# Patient Record
Sex: Female | Born: 1955 | Race: Black or African American | Hispanic: No | Smoking: Former smoker
Health system: Southern US, Community
[De-identification: ages and names within clinical notes are randomized; demographics above are authoritative.]

## PROBLEM LIST (undated history)

## (undated) DIAGNOSIS — I10 Essential (primary) hypertension: Secondary | ICD-10-CM

## (undated) DIAGNOSIS — D259 Leiomyoma of uterus, unspecified: Secondary | ICD-10-CM

## (undated) DIAGNOSIS — C3490 Malignant neoplasm of unspecified part of unspecified bronchus or lung: Secondary | ICD-10-CM

## (undated) DIAGNOSIS — I350 Nonrheumatic aortic (valve) stenosis: Secondary | ICD-10-CM

## (undated) DIAGNOSIS — A64 Unspecified sexually transmitted disease: Secondary | ICD-10-CM

## (undated) DIAGNOSIS — I351 Nonrheumatic aortic (valve) insufficiency: Secondary | ICD-10-CM

## (undated) DIAGNOSIS — I739 Peripheral vascular disease, unspecified: Secondary | ICD-10-CM

## (undated) DIAGNOSIS — K219 Gastro-esophageal reflux disease without esophagitis: Secondary | ICD-10-CM

## (undated) DIAGNOSIS — K589 Irritable bowel syndrome without diarrhea: Secondary | ICD-10-CM

## (undated) DIAGNOSIS — I7122 Aneurysm of the aortic arch, without rupture: Secondary | ICD-10-CM

## (undated) DIAGNOSIS — I7121 Aneurysm of the ascending aorta, without rupture: Secondary | ICD-10-CM

## (undated) DIAGNOSIS — I712 Thoracic aortic aneurysm, without rupture: Secondary | ICD-10-CM

## (undated) DIAGNOSIS — R011 Cardiac murmur, unspecified: Secondary | ICD-10-CM

## (undated) DIAGNOSIS — I5030 Unspecified diastolic (congestive) heart failure: Secondary | ICD-10-CM

## (undated) DIAGNOSIS — D219 Benign neoplasm of connective and other soft tissue, unspecified: Secondary | ICD-10-CM

## (undated) DIAGNOSIS — A599 Trichomoniasis, unspecified: Secondary | ICD-10-CM

## (undated) HISTORY — DX: Aneurysm of the aortic arch, without rupture: I71.22

## (undated) HISTORY — DX: Aneurysm of the ascending aorta, without rupture: I71.21

## (undated) HISTORY — DX: Leiomyoma of uterus, unspecified: D25.9

## (undated) HISTORY — DX: Nonrheumatic aortic (valve) insufficiency: I35.1

## (undated) HISTORY — PX: CARDIAC SURGERY: SHX584

## (undated) HISTORY — DX: Irritable bowel syndrome, unspecified: K58.9

## (undated) HISTORY — DX: Thoracic aortic aneurysm, without rupture: I71.2

## (undated) HISTORY — DX: Gastro-esophageal reflux disease without esophagitis: K21.9

## (undated) HISTORY — DX: Cardiac murmur, unspecified: R01.1

## (undated) HISTORY — DX: Benign neoplasm of connective and other soft tissue, unspecified: D21.9

## (undated) HISTORY — DX: Essential (primary) hypertension: I10

## (undated) HISTORY — PX: THORACIC AORTIC ANEURYSM REPAIR: SHX799

## (undated) HISTORY — DX: Malignant neoplasm of unspecified part of unspecified bronchus or lung: C34.90

## (undated) HISTORY — PX: ABDOMINAL HYSTERECTOMY: SHX81

## (undated) HISTORY — DX: Trichomoniasis, unspecified: A59.9

## (undated) HISTORY — DX: Peripheral vascular disease, unspecified: I73.9

## (undated) HISTORY — PX: LUNG SURGERY: SHX703

## (undated) HISTORY — PX: OTHER SURGICAL HISTORY: SHX169

## (undated) HISTORY — PX: TUBAL LIGATION: SHX77

## (undated) HISTORY — PX: AORTIC VALVE REPLACEMENT: SHX41

## (undated) HISTORY — DX: Unspecified sexually transmitted disease: A64

## (undated) HISTORY — PX: AORTIC VALVE REPAIR: SHX6306

---
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
## 2005-04-02 ENCOUNTER — Observation Stay (HOSPITAL_COMMUNITY): Admission: RE | Admit: 2005-04-02 | Discharge: 2005-04-03 | Payer: Self-pay | Admitting: Gynecology

## 2006-05-05 ENCOUNTER — Ambulatory Visit: Payer: Self-pay | Admitting: Internal Medicine

## 2006-05-07 ENCOUNTER — Ambulatory Visit: Payer: Self-pay | Admitting: Internal Medicine

## 2006-06-21 ENCOUNTER — Ambulatory Visit: Payer: Self-pay | Admitting: Surgery

## 2006-06-21 HISTORY — PX: BREAST EXCISIONAL BIOPSY: SUR124

## 2006-12-28 ENCOUNTER — Ambulatory Visit: Payer: Self-pay | Admitting: Internal Medicine

## 2007-02-28 ENCOUNTER — Ambulatory Visit: Payer: Self-pay | Admitting: Gastroenterology

## 2007-05-25 ENCOUNTER — Ambulatory Visit: Payer: Self-pay | Admitting: Internal Medicine

## 2008-06-13 ENCOUNTER — Ambulatory Visit: Payer: Self-pay | Admitting: Internal Medicine

## 2009-05-06 ENCOUNTER — Ambulatory Visit: Payer: Self-pay | Admitting: Family Medicine

## 2009-05-06 DIAGNOSIS — K219 Gastro-esophageal reflux disease without esophagitis: Secondary | ICD-10-CM | POA: Insufficient documentation

## 2009-05-06 DIAGNOSIS — M79609 Pain in unspecified limb: Secondary | ICD-10-CM | POA: Insufficient documentation

## 2009-05-06 DIAGNOSIS — S139XXA Sprain of joints and ligaments of unspecified parts of neck, initial encounter: Secondary | ICD-10-CM | POA: Insufficient documentation

## 2009-05-06 DIAGNOSIS — F17200 Nicotine dependence, unspecified, uncomplicated: Secondary | ICD-10-CM | POA: Insufficient documentation

## 2009-06-03 ENCOUNTER — Ambulatory Visit: Payer: Self-pay | Admitting: Family Medicine

## 2009-06-03 DIAGNOSIS — M25519 Pain in unspecified shoulder: Secondary | ICD-10-CM | POA: Insufficient documentation

## 2009-06-03 DIAGNOSIS — M542 Cervicalgia: Secondary | ICD-10-CM | POA: Insufficient documentation

## 2009-06-24 ENCOUNTER — Ambulatory Visit (HOSPITAL_COMMUNITY): Admission: RE | Admit: 2009-06-24 | Discharge: 2009-06-24 | Payer: Self-pay | Admitting: Family Medicine

## 2009-07-24 ENCOUNTER — Ambulatory Visit: Payer: Self-pay | Admitting: Internal Medicine

## 2009-08-20 ENCOUNTER — Ambulatory Visit: Payer: Self-pay | Admitting: Internal Medicine

## 2009-09-17 ENCOUNTER — Encounter: Payer: Self-pay | Admitting: Physician Assistant

## 2010-03-30 ENCOUNTER — Encounter: Payer: Self-pay | Admitting: Family Medicine

## 2010-04-08 NOTE — Assessment & Plan Note (Signed)
Summary: new patient - room 3   Vital Signs:  Patient profile:   55 year old female Weight:      162.75 pounds O2 Sat:      97 % on Room air Pulse rate:   91 / minute Resp:     16 per minute BP sitting:   110 / 70  (left arm)  Vitals Entered By: Adella Hare LPN (May 06, 2009 1:59 PM) CC: new patient  Is Patient Diabetic? No Pain Assessment Patient in pain? no        CC:  new patient .  History of Present Illness: New pt here to establish with a new PCP.  c/o pain bilat LE with standing mvmt after prolonged sitting or getting out of car x few months. Aching in muscles.  No joint pain or swelling. Denies LBP or swelling.  also s/o neck pain.  onset Nov 2010.   No trauma.  Awoke with it hurting& stiff one morning.  Saw chiropractor.  Had xrays at chiropractor & was told she had arthritis.  Still has some stiffness & discomfort with mvmt but overall is better.  Not currently seeing the chiropractor due to cost.  Pt started Chantix 1 wk ago.  Trying to d/c smoking. Went up to two times a day though & caused nausea & insomnia, so is only taking once daily.  Hx of GERD.  Well controlled with omeprazole. Also hx of palpitations.  Controlled with metoprolol.  No hx of HTN. Pt states she believes she has had blood work fairly recently.  She will sign a records release to get records from prev dr.   Habits & Providers  Alcohol-Tobacco-Diet     Tobacco Status: current  Exercise-Depression-Behavior     Does Patient Exercise: yes     Drug Use: no  Allergies (verified): No Known Drug Allergies  Past History:  Past Medical History: Palpitations GERD  Past Surgical History: Hysterectomy - due to fibroids. 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 CV:  Denies chest pain or discomfort and palpitations. Resp:  Denies cough and shortness of breath. GI:  Denies indigestion, nausea, and vomiting. MS:  Complains of stiffness; denies joint pain, joint swelling, loss of strength, low back pain, mid back pain, and muscle weakness. Neuro:  Denies numbness, tingling, and weakness.  Physical Exam  General:  Well-developed,well-nourished,in no acute distress; alert,appropriate and cooperative throughout examination Head:  Normocephalic and atraumatic without obvious abnormalities. No apparent alopecia or balding. Ears:  External ear exam shows no significant lesions or deformities.  Otoscopic examination reveals clear canals, tympanic membranes are intact bilaterally without bulging, retraction, inflammation or discharge. Hearing is grossly normal bilaterally. Nose:  External nasal examination shows no deformity or inflammation. Nasal mucosa are pink and moist without lesions or exudates. Mouth:  Oral mucosa and oropharynx without lesions or exudates.  Teeth in good repair. Neck:  No deformities, masses, or tenderness noted.full ROM and no thyromegaly.   Lungs:  Normal respiratory effort, chest expands symmetrically. Lungs are clear to auscultation, no crackles or wheezes. Heart:  Normal rate and  regular rhythm. S1 and S2 normal without gallop, murmur, click, rub or other extra sounds. Pulses:  R femoral normal, R posterior tibial normal, R dorsalis pedis normal, L femoral normal, L posterior tibial normal, and L dorsalis pedis normal.   Extremities:  No clubbing, cyanosis, edema, or deformity noted with normal full range of motion of all joints.   Neurologic:  alert & oriented X3, strength normal in all extremities,  sensation intact to light touch, gait normal, and DTRs symmetrical and normal.     Impression & Recommendations:  Problem # 1:  CERVICAL MUSCLE STRAIN (ICD-847.0) Assessment Improved  Discussed exercises and medication. Handout given with stretches for neck.  Her updated medication list for this problem includes:    Mobic 7.5 Mg Tabs (Meloxicam) .Marland Kitchen... 1 daily for arthritis pain  Problem # 2:  LEG PAIN, BILATERAL (ICD-729.5) Assessment: Unchanged Question arthritis pain.  Will see if Mobic helps.   Pt exercises every other day (no discomfor).  Encouraged her to continue.  Problem # 3:  GERD (ICD-530.81) Assessment: Improved  Her updated medication list for this problem includes:    Omeprazole 40 Mg Cpdr (Omeprazole) ..... One cap by mouth once daily  Problem # 4:  NICOTINE ADDICTION (ICD-305.1) Assessment: Unchanged  Continue Chantix.  Her updated medication list for this problem includes:    Chantix 1 Mg Tabs (Varenicline tartrate) ..... Uad  Complete Medication List: 1)  Omeprazole 40 Mg Cpdr (Omeprazole) .... One cap by mouth once daily 2)  Metoprolol Succinate 50 Mg Xr24h-tab (Metoprolol succinate) .... One tab by mouth once daily 3)  Mobic 7.5 Mg Tabs (Meloxicam) .Marland Kitchen.. 1 daily for arthritis pain 4)  Chantix 1 Mg Tabs (Varenicline tartrate) .... Uad  Patient Instructions: 1)  Please schedule a follow-up appointment in 1 month. 2)  Start Mobic once daily for arthritis pain. 3)  Begin neck exercises. 4)  Tobacco is very bad for your health and your loved ones! You Should stop smoking!.  Continue chantix. Prescriptions: MOBIC 7.5 MG TABS (MELOXICAM) 1 daily for arthritis pain  #30 x 1   Entered and Authorized by:   Esperanza Sheets PA   Signed by:   Esperanza Sheets PA on 05/06/2009   Method used:   Electronically to        [facility] (retail)       [address]       [place], [place]  [address]       Ph: [phone]       Fax:  [phone]   RxID:   [id number]

## 2010-04-08 NOTE — Letter (Signed)
Summary: Letter  Letter   Imported By: Lind Guest 09/17/2009 15:25:03  _____________________________________________________________________  External Attachment:    Type:   Image     Comment:   External Document

## 2010-04-08 NOTE — Assessment & Plan Note (Signed)
Summary: office visit room 1   Vital Signs:  Patient profile:   55 year old female Height:      67 inches Weight:      163 pounds BMI:     25.62 O2 Sat:      99 % on Room air Pulse rate:   78 / minute BP sitting:   110 / 72  (left arm) CC: here today for a follow up on medication, patient does complain of neck bothering her going on months now.   Is Patient Diabetic? No   CC:  here today for a follow up on medication and patient does complain of neck bothering her going on months now.  .  History of Present Illness: Pt is here for follow up.  Pt quit cigs 2 wks ago.  Is taking Chantix.    Still having some pain in neck.  Stiff & achey. Worse when 1st awakens in am.  See prev visit.  Pt states the Mobic is helping.  Notices a difference if she doesn't take it.  Also has some pain in her Rt Shoulder off & on with certain mvmts.  Chiropractor in the past helped but pt is unable to afford anymore.  Has not tried PT.  Is not consistently doing the stretches I gave her at her previous visit.  Pt states she did receive a shot from her prev Dr that helped alot.  Wonders if we can give her one today.  Genella Rife is still doing well with Omeprazole.  Needs a refill. Also needs an order for a mamm.  Allergies: No Known Drug Allergies  Past History:  Past medical, surgical, family and social histories (including risk factors) reviewed, and no changes noted (except as noted below).  Past Medical History: Palpitations GERD Chronic cervical neck pain  Past Surgical History: Reviewed history from 05/06/2009 and no changes required. Hysterectomy - due to fibroids. D&C x 2  Family History: Reviewed history from 05/06/2009 and no changes required. mother deceased- altzheimers father deceased- prostate cancer half brother and half sister- CHF  Social History: Reviewed history from 05/06/2009 and no changes required. 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 Resp:  Denies shortness of breath. GI:  Denies indigestion, loss of appetite, nausea, and vomiting. MS:  Complains of joint pain and stiffness; denies low back pain and mid back pain. Neuro:  Denies numbness and tingling.  Physical Exam  General:  Well-developed,well-nourished,in no acute distress; alert,appropriate and cooperative throughout examination Head:  Normocephalic and atraumatic without obvious abnormalities. No apparent alopecia or balding. Msk:  Rt Shoulder normal ROM, no joint tenderness, no joint swelling, no joint instability, no crepitation, and no muscle atrophy.   C Spine normal ROM.  Muscular TTP & tightness noted paraspinal & across bilat post shoulders (trapezius). Pulses:  R radial normal and L radial normal.   Extremities:  No clubbing, cyanosis, edema, or deformity noted with normal full range of motion of all joints.   Neurologic:  alert & oriented X3, strength normal in all extremities, sensation intact to light touch, and DTRs symmetrical and normal.   Psych:  Cognition and judgment appear intact. Alert and cooperative with normal attention span and concentration. No apparent delusions, illusions, hallucinations   Impression & Recommendations:  Problem # 1:  CERVICAL MUSCLE STRAIN (ICD-847.0) Assessment  Improved Pt encourage to continue Meloxicam. Added Flexeril at bedtime. Encouraged pt to do the neck stretches once daily. Discussed PT. Pt will check on ins coverage.  The following medications were removed from the medication list:    Mobic 7.5 Mg Tabs (Meloxicam) .Marland Kitchen... 1 daily for arthritis pain Her updated medication list for this problem includes:    Cyclobenzaprine Hcl 10 Mg Tabs (Cyclobenzaprine hcl) .Marland Kitchen... Take 1 at bedtime for muscle spasm    Meloxicam 7.5 Mg Tabs (Meloxicam) .Marland Kitchen... Take 1  daily  Orders: Depo- Medrol 80mg  (J1040)  Problem # 2:  SHOULDER PAIN, RIGHT, CHRONIC (ICD-719.41) Assessment: Unchanged Same as above.  The following medications were removed from the medication list:    Mobic 7.5 Mg Tabs (Meloxicam) .Marland Kitchen... 1 daily for arthritis pain Her updated medication list for this problem includes:    Cyclobenzaprine Hcl 10 Mg Tabs (Cyclobenzaprine hcl) .Marland Kitchen... Take 1 at bedtime for muscle spasm    Meloxicam 7.5 Mg Tabs (Meloxicam) .Marland Kitchen... Take 1 daily  Problem # 3:  GERD (ICD-530.81) Assessment: Improved controlled  Her updated medication list for this problem includes:    Omeprazole 40 Mg Cpdr (Omeprazole) ..... One cap by mouth once daily  Problem # 4:  NICOTINE ADDICTION (ICD-305.1) Assessment: Improved Pt quit 2 wks ago.  Her updated medication list for this problem includes:    Chantix 1 Mg Tabs (Varenicline tartrate) ..... Uad  Complete Medication List: 1)  Omeprazole 40 Mg Cpdr (Omeprazole) .... One cap by mouth once daily 2)  Metoprolol Succinate 50 Mg Xr24h-tab (Metoprolol succinate) .... One tab by mouth once daily 3)  Chantix 1 Mg Tabs (Varenicline tartrate) .... Uad 4)  Cyclobenzaprine Hcl 10 Mg Tabs (Cyclobenzaprine hcl) .... Take 1 at bedtime for muscle spasm 5)  Meloxicam 7.5 Mg Tabs (Meloxicam) .... Take 1 daily  Other Orders: Misc. Referral (Misc. Ref)  Patient Instructions: 1)  Please schedule a follow-up appointment in 3 months. 2)  Congratulations on quitting smoking!  Keep up the good work. 3)  Do the exercises I have given you once daily. 4)  You may also use heat to your neck area as needed to help with spasm. 5)  I have refilled the Meloxicam & added a muscle relaxer to use at bedtime. 6)  If your ins will cover physical therapy let me know & I will order this for your neck & shoulder. Prescriptions: OMEPRAZOLE 40 MG CPDR (OMEPRAZOLE) one cap by mouth once daily  #30 x 5   Entered and Authorized by:   Esperanza Sheets PA    Signed by:   Esperanza Sheets PA on 06/03/2009   Method used:   Electronically to        [facility] (retail)       [address]       [place], [place]  [address]       Ph: [phone]       Fax: [phone]   RxID:   [id number] MELOXICAM 7.5 MG TABS (MELOXICAM) take 1 daily  #30 x 2   Entered and Authorized by:   Esperanza Sheets PA   Signed by:   Esperanza Sheets PA on 06/03/2009   Method used:   Electronically to        [facility] (retail)       [address]       [place], [place]  [address]       Ph: [phone]       Fax: [phone]   RxID:   [id number] CYCLOBENZAPRINE HCL 10 MG TABS (CYCLOBENZAPRINE HCL) take 1 at bedtime for muscle spasm  #30 x 1   Entered and Authorized by:   Esperanza Sheets PA   Signed by:   Esperanza Sheets PA on 06/03/2009   Method used:   Electronically to        [facility] (retail)       [address]       [place], [place]  [address]       Ph: [phone]       Fax: [phone]   RxID:   [id number]    Medication Administration  Injection # 1:    Medication: Depo- Medrol 80mg     Diagnosis: NECK PAIN (ICD-723.1)    Route: IM    Site: RUOQ gluteus    Exp Date: 01/2010    Lot #: [id number]    Mfr: Pharmacia    Comments: 80mg  given     Patient tolerated injection without complications    Given by: Everitt Amber LPN (June 03, 2009 2:19 PM)  Orders Added: 1)  Depo- Medrol 80mg  [J1040] 2)  Misc. Referral [Misc. Ref]  Appended Document: office visit room 1   Orders Added: 1)  Admin of Therapeutic Inj  intramuscular or subcutaneous [88416]

## 2010-07-25 NOTE — Discharge Summary (Signed)
NAME:  Catherine Nichols, Catherine Nichols NO.:  [id number]   MEDICAL RECORD NO.:  [id number]          PATIENT TYPE:  OBV   LOCATION:  9307                          FACILITY:  [facility]   PHYSICIAN:  Ginger Carne, MD  DATE OF BIRTH:  1955/05/23   DATE OF ADMISSION:  04/02/2005  DATE OF DISCHARGE:  04/03/2005                                 DISCHARGE SUMMARY   REASON FOR HOSPITALIZATION:  Genuine urinary stress incontinence.   IN HOSPITAL PROCEDURES:  Tension-free vaginal tape procedure with  cystoscopy.   FINAL DIAGNOSIS:  Genuine urinary stress incontinence.   HOSPITAL COURSE:  This is a 55 year old African-American female status post  hysterectomy admitted because of genuine urinary stress incontinence.  Appropriate work up performed prior to surgery.  The patient has no co-  morbidities.  Intraoperative course was uneventful.   Postoperatively the patient did well.  She was afebrile.  She voided about  350 cc after Foley catheter was removed in the A.M. of April 03, 2005.  Incision is dry.  No vaginal bleeding.  Calves without tenderness.  Lungs  were clear.   DISCHARGE INSTRUCTIONS:  Discharge instructions included contacting the  office for temperature elevation about 100.4 degrees F, incisional pain,  vaginal drainage or bleeding, difficulty voiding, constipation or any other  concerns.  The patient was asked to return in three weeks for a  postoperative check up.  Percocet 5/325 mg one to two every 4-6 hours and  Levaquin 500 mg daily for 7 days were prescribed.  Timed voids every 4 hours  including evening were prescribed for four weeks.      Ginger Carne, MD  Electronically Signed     SHB/MEDQ  D:  04/03/2005  T:  04/03/2005  Job:  [id number]

## 2010-07-25 NOTE — H&P (Signed)
NAME:  Catherine Nichols, Catherine Nichols NO.:  [id number]   MEDICAL RECORD NO.:  [id number]          PATIENT TYPE:  AMB   LOCATION:  SDC                           [facility]ILITY:  [facility]   PHYSICIAN:  Ginger Carne, MD  DATE OF BIRTH:  Feb 15, 1956   DATE OF ADMISSION:  04/02/2005  DATE OF DISCHARGE:                                HISTORY & PHYSICAL   REASON FOR ADMISSION:  Urinary stress incontinence.   HISTORY OF PRESENT ILLNESS:  This patient is a 55 year old gravida 1, para  1, 0, 0, 1, African-American female with a 2 to 3 year history of worsening  genuine urinary stress incontinence. The patient loses urine with coughing,  straining, and other valsalva maneuvers. She denies loss of urine at rest  and has no symptoms of an overactive bladder. She denies nocturia, straining  to void or post void dribbling. She has never had any bladder surgery or  urological diseases in the past. She takes no medications to enhance her  propensity for urinary loss. She denies fecal incontinence. She has no  neurological diseases that would effect her bladder function. The patient  has utilized Kegel exercises in the past, not beneficial.   OB/GYN HISTORY:  The patient has had one normal vaginal delivery.   PAST MEDICAL HISTORY:  Reflux esophagitis.   PAST SURGICAL HISTORY:  Laparoscopic assisted vaginal hysterectomy with  preservation of both tubes and ovaries in 1999.   MEDICATIONS:  1.  Nexium 40 mg daily.  2.  Zyrtec 10 mg p.r.n.  3.  Premarin 0.625 mg daily.   ALLERGIES:  ASPIRIN.   SOCIAL HISTORY:  The patient smokes 1-1/2 packs of cigarettes daily. Denies  alcohol or drug abuse.   FAMILY HISTORY:  Her mother had Alzheimer's disease and is deceased. Father  has prostate carcinoma.   REVIEW OF SYSTEMS:  Negative.   PHYSICAL EXAMINATION:  VITAL SIGNS: Blood pressure is 128/85, pulse 85 and  regular. Height 67 inches. Weight 165 pounds.  HEENT:  Grossly normal.  BREAST EXAM:   Without mass, discharge, thickening or tenderness.  CHEST:  Clear to percussion and auscultation.  CARDIOVASCULAR EXAM: Without murmurs or enlargement. Regular rate and  rhythm.  LYMPHATICS/SKIN, NEUROLOGICAL/MUSCULOSKELETAL SYSTEMS: Normal.  ABDOMEN:  Soft without gross hepatosplenomegaly.  PELVIC EXAM:  External genitalia, vulva and vagina normal. Cervix and uterus  are surgically absent. Both adnexa palpable, found to be normal.  RECTAL EXAM:  Hemoccult negative without masses.   Urinalysis is normal, residual urine volume 16 cc. Filling cystometry in the  office demonstrates a first urge to void at 100 cc and patient demonstrates  no evidence for spontaneous detrusor contractions.   IMPRESSION:  Genuine urinary stress incontinence.   PLAN:  The patient is scheduled for a tension free vaginal tape procedure  and cystoscopy. The nature of said procedure discussed in detail including  risks involving possible postoperative urinary retention, infection, graft  rejection, erosion or infection. Recurrent genuine urinary stress  incontinence including urge incontinence. All questions answered to the  satisfaction of said patient.      Ginger Carne, MD  Electronically Signed     SHB/MEDQ  D:  03/31/2005  T:  03/31/2005  Job:  [id number]

## 2010-07-25 NOTE — Op Note (Signed)
NAME:  Catherine Nichols, Catherine Nichols NO.:  [id number]   MEDICAL RECORD NO.:  [id number]          PATIENT TYPE:  OBV   LOCATION:  9399                          [facility]ILITY:  [facility]   PHYSICIAN:  Ginger Carne, MD  DATE OF BIRTH:  1955-09-14   DATE OF PROCEDURE:  04/02/2005  DATE OF DISCHARGE:                                 OPERATIVE REPORT   PREOPERATIVE DIAGNOSIS:  Genuine urinary stress incontinence.   POSTOPERATIVE DIAGNOSIS:  Genuine urinary stress incontinence.   PROCEDURE:  Tension-free vaginal tape procedure with cystoscopy.   SURGEON:  Ginger Carne, M.D.   ASSISTANT:  None.   COMPLICATIONS:  None immediate.   ESTIMATED BLOOD LOSS:  Minimal.   SPECIMEN:  None.   ANESTHESIA:  General.   OPERATIVE FINDINGS:  Cystoscopy revealed no abnormalities of the urethra,  trigone or bladder walls.  Ureteral orifices identified following placement  of the TVT Advantage system vaginal tapes.  Cystoscopy revealed no evidence  of injury or violation of bladder on either side.  The bladder was filled to  300 mL and the dome was observed.  External genitalia, vulva and vagina were  normal.  The patient had a previous hysterectomy.  Cervix and uterus absent.   OPERATIVE PROCEDURE:  The patient prepped and draped in the usual fashion  and placed in the lithotomy position.  Betadine solution used for antiseptic  and the patient was catheterized prior to the procedure.  After adequate  general anesthesia, a vertical anterior vaginal wall incision was made in  the midline for approximately 4 cm.  Marcaine with epinephrine was injected  subepithelially prior to making said incision.  The pubovesical cervical  fascia was dissected off the vaginal epithelium, space of Retzius not  entered but identified on either side.  Using a bottom-up Advantage TVT  system, trocars were placed in such manner that the emanated about 1-2 cm  lateral from the symphysis pubis on either side.  A  bladder manipulator was  used to during the course of the procedure.  Afterwards cystoscopy performed  and aforementioned findings noted.  Following this, bladder was empty,  filled to 240 mL of normal saline, and appropriate tensioning of the tape  followed.  Afterwards the tape was cut beneath the skin and Dermabond used  for said closure.  Copious irrigation of lactated Ringer's followed.  Afterwards, bladder  emptied and 2 cm lateral to either side of the midline at the urethra, 0  Vicryl sutures were placed to anchor the TVT system to avoid migration  posteriorly.  The cuff was then closed with 2-0 Monocryl running  interlocking suture.  The patient tolerated the procedure well and returned  to the post anesthesia recovery room in excellent condition.      Ginger Carne, MD  Electronically Signed     SHB/MEDQ  D:  04/02/2005  T:  04/02/2005  Job:  [id number]

## 2013-01-12 HISTORY — PX: ESOPHAGOGASTRODUODENOSCOPY: SHX1529

## 2013-01-12 HISTORY — PX: COLONOSCOPY: SHX174

## 2013-01-13 ENCOUNTER — Encounter: Payer: Self-pay | Admitting: Gastroenterology

## 2014-01-18 LAB — HM MAMMOGRAPHY

## 2014-02-15 ENCOUNTER — Telehealth: Payer: Self-pay | Admitting: Certified Nurse Midwife

## 2014-02-15 NOTE — Telephone Encounter (Signed)
lmtcb to schedule a new patient md referral to establish care and for frequent uti's.

## 2014-02-21 ENCOUNTER — Encounter: Payer: Self-pay | Admitting: Certified Nurse Midwife

## 2014-02-21 ENCOUNTER — Ambulatory Visit (INDEPENDENT_AMBULATORY_CARE_PROVIDER_SITE_OTHER): Payer: BC Managed Care – PPO | Admitting: Certified Nurse Midwife

## 2014-02-21 VITALS — BP 124/74 | HR 70 | Resp 16 | Ht 65.75 in | Wt 152.0 lb

## 2014-02-21 DIAGNOSIS — N390 Urinary tract infection, site not specified: Secondary | ICD-10-CM

## 2014-02-21 DIAGNOSIS — N952 Postmenopausal atrophic vaginitis: Secondary | ICD-10-CM

## 2014-02-21 DIAGNOSIS — Z0000 Encounter for general adult medical examination without abnormal findings: Secondary | ICD-10-CM

## 2014-02-21 DIAGNOSIS — Z01419 Encounter for gynecological examination (general) (routine) without abnormal findings: Secondary | ICD-10-CM

## 2014-02-21 LAB — POCT URINALYSIS DIPSTICK
Bilirubin, UA: NEGATIVE
Blood, UA: NEGATIVE
Glucose, UA: NEGATIVE
Ketones, UA: NEGATIVE
Nitrite, UA: NEGATIVE
Protein, UA: NEGATIVE
Urobilinogen, UA: NEGATIVE
pH, UA: 5

## 2014-02-21 MED ORDER — PHENAZOPYRIDINE HCL 100 MG PO TABS: 100.0000 mg | ORAL_TABLET | Freq: Three times a day (TID) | ORAL | Status: DC | PRN

## 2014-02-21 NOTE — Patient Instructions (Signed)
Atrophic Vaginitis Atrophic vaginitis is a problem of low levels of estrogen in women. This problem can happen at any age. It is most common in women who have gone through menopause ("the change").  HOW WILL I KNOW IF I HAVE THIS PROBLEM? You may have:  Trouble with peeing (urinating), such as:  Going to the bathroom often.  A hard time holding your pee until you reach a bathroom.  Leaking pee.  Having pain when you pee.  Itching or a burning feeling.  Vaginal bleeding and spotting.  Pain during sex.  Dryness of the vagina.  A yellow, bad-smelling fluid (discharge) coming from the vagina. HOW WILL MY DOCTOR CHECK FOR THIS PROBLEM?  During your exam, your doctor will likely find the problem.  If there is a vaginal fluid, it may be checked for infection. HOW WILL THIS PROBLEM BE TREATED? Keep the vulvar skin as clean as possible. Moisturizers and lubricants can help with some of the symptoms. Estrogen replacement can help. There are 2 ways to take estrogen:  Systemic estrogen gets estrogen to your whole body. It takes many weeks or months before the symptoms get better.  You take an estrogen pill.  You use a skin patch. This is a patch that you put on your skin.  If you still have your uterus, your doctor may ask you to take a hormone. Talk to your doctor about the right medicine for you.  Estrogen cream.  This puts estrogen only at the part of your body where you apply it. The cream is put into the vagina or put on the vulvar skin. For some women, estrogen cream works faster than pills or the patch. CAN ALL WOMEN WITH THIS PROBLEM USE ESTROGEN? No. Women with certain types of cancer, liver problems, or problems with blood clots should not take estrogen. Your doctor can help you decide the best treatment for your symptoms. Document Released: 08/12/2007 Document Revised: 02/28/2013 Document Reviewed: 08/12/2007 [facility] Patient Information 2015 [place], [place]. This  information is not intended to replace advice given to you by your health care provider. Make sure you discuss any questions you have with your health care provider.

## 2014-02-21 NOTE — Progress Notes (Signed)
58 y.o. [id number] Divorced African American Fe here to establish care for problem of ? Stool in vagina.Pateint was referred from PCP for questionable bowel noted in vagina.Patient was seen for bladder infection with PCP treated with antibiotic 2 weeks ago, still having slight cramping with urine at times. Patient has colon spasms with frequent diarrhea and saw stool she thought came from vagina, happened more than once. Patient has frequent self treated vagina infections treated with Monistat frequently. Denies incontinence, but wears pant liners daily. Not sexually active in > 5 years. Here to have evaluation. History of TAH with ovaries retained for fibroid, so no pelvic exam since surgery. Patient does have breast exam with Dr. Carlis Abbott.  Patient's last menstrual period was 03/09/1998.          Sexually active: No.  The current method of family planning is status post hysterectomy.    Exercising: No.  exercise Smoker:  yes  Health Maintenance: Pap:  Yrs ago MMG:  10/15 neg per patient Colonoscopy:  11/14 normal 10 years BMD:   Over 75yrs ago TDaP: 2015 Labs: Poct urine-wbc tr Self breast exam: done occ   reports that she has been smoking.  She does not have any smokeless tobacco history on file. She reports that she does not drink alcohol or use illicit drugs.  Past Medical History  Diagnosis Date  . Heart murmur   . Fibroid   . STD (sexually transmitted disease)     trich    Past Surgical History  Procedure Laterality Date  . Tubal ligation    . Abdominal hysterectomy      Current Outpatient Prescriptions  Medication Sig Dispense Refill  . dicyclomine (BENTYL) 10 MG capsule TAKE ONE CAPSULE BY MOUTH 4 TIMES DAILY FOR  COLON  SPASMS    . meloxicam (MOBIC) 15 MG tablet Take by mouth.    . metoprolol succinate (TOPROL-XL) 50 MG 24 hr tablet Take by mouth.    Marland Kitchen omeprazole (PRILOSEC) 40 MG capsule TAKE ONE CAPSULE BY MOUTH EVERY DAY     No current facility-administered medications  for this visit.    No family history on file.  ROS:  Pertinent items are noted in HPI.  Otherwise, a comprehensive ROS was negative.  Exam:   BP 124/74 mmHg  Pulse 70  Resp 16  Ht 5' 5.75" (1.67 m)  Wt 152 lb (68.947 kg)  BMI 24.72 kg/m2  LMP 03/09/1998 Height: 5' 5.75" (167 cm)  Ht Readings from Last 3 Encounters:  02/21/14 5' 5.75" (1.67 m)  06/03/09 5\' 7"  (1.702 m)    General appearance: alert, cooperative and appears stated age Head: Normocephalic, without obvious abnormality, atraumatic CVAT negative bilateral Abdomen: soft, non-tender; no masses,  no organomegaly, negative suprapubic Skin: Skin color, texture, turgor normal. No rashes or lesions, warm and dry No abnormal inguinal nodes palpated    Pelvic: External genitalia:  no lesions              Urethra:  normal appearing urethra with no masses, tenderness or lesions              Bartholin's and Skene's: normal                 Vagina:atrophic appearing vagina with normal color and scant discharge, no odor, small thin slight increased pink area noted in fourchette of vagina, appears to be scar tissue from previous episiotomy. No open areas from vagina to rectum noted, Dr. Quincy Simmonds examined area and agrees with  finding. Good vaginal tone noted              Cervix: absent             Bimanual Exam:  Uterus:  uterus absent              Adnexa: normal adnexa and no mass, fullness, tenderness               Rectovaginal: Confirms               Anus:  normal sphincter tone, small hemorrhoid noted, not thrombosed, but slightly tender. No stool noted in outer area or anus or with rectal exam.  A:  Normal pelvic exam  Atrophic vaginitis  No vaginal/ rectal fistula noted  Hemorrhoid not thrombosed  Wet Prep negative for pathogens  UTI resolved?  P:   Reviewed findings of normal pelvic exam  Discussed vaginal infection feelings or irritation are not yeast but vaginal dryness. Discussed etiology and use of Olive Oil or coconut  oil to treat with daily applications at hs or 3 x weekly if symptoms are resolved. Discussed with patient when she thinks she has another vaginal infection to come in for assessment, do no treat with OTC, so can be evaluated.  Discussed findings on normal vaginal/rectal area and no fistula noted. Patient relieved.  Discussed use of Balmex on hemorrhoid area for comfort and protect tissue. Warning signs given.  Lab: Urine micro,culture  Rx Pyridium for discomfort see order, Increase water intake and limit soda intake. Aware of UTI warning signs. Be sure when having diarrhea to pat area of urination and only wipe area of rectum, to decrease of UTI.  Questions addressed.  Rv prn, aex gyn exam  20 minutes spent with patient  in face to face counseling regarding atrophic vaginitis, hemorrhoid care and prevention of UTI.   An After Visit Summary was printed and given to the patient.

## 2014-02-22 LAB — URINALYSIS, MICROSCOPIC ONLY
Bacteria, UA: NONE SEEN
Casts: NONE SEEN
Crystals: NONE SEEN
Squamous Epithelial / LPF: NONE SEEN

## 2014-02-23 LAB — URINE CULTURE
Colony Count: NO GROWTH
Organism ID, Bacteria: NO GROWTH

## 2014-02-23 NOTE — Progress Notes (Signed)
Pt probably needs barium enema for complete evaluation of fistula.  We can discuss.  Reviewed personally.  Felipa Emory, MD.

## 2014-03-24 NOTE — Progress Notes (Signed)
Please call patient as follow up to see if any more concerns of stool from the vagina? Dr. Quincy Simmonds and I assessed patient and found no problems or concerns. Patient was to advise if any change.

## 2014-03-26 ENCOUNTER — Telehealth: Payer: Self-pay

## 2014-03-26 NOTE — Telephone Encounter (Signed)
lmtcb to see if patient noticed any stool in vagina.

## 2014-03-26 NOTE — Progress Notes (Signed)
No answer.Left message for patient to call our office.

## 2014-03-26 NOTE — Telephone Encounter (Signed)
-----   Message from Regina Eck, CNM sent at 03/24/2014  9:28 AM EST -----   ----- Message -----    From: Lyman Speller, MD    Sent: 02/23/2014   6:37 AM      To: Regina Eck, CNM    ----- Message -----    From: Regina Eck, CNM    Sent: 02/21/2014  12:29 PM      To: Lyman Speller, MD

## 2014-03-27 NOTE — Telephone Encounter (Signed)
Pt states she has not noticed anymore stool in her vagina. Pt aware to call if any problems.

## 2014-03-27 NOTE — Telephone Encounter (Signed)
Left message for call back.

## 2014-03-27 NOTE — Progress Notes (Signed)
Pt states she has not noticed anymore stool in her vagina. Pt aware to call if any problems.

## 2014-03-27 NOTE — Progress Notes (Signed)
Left message to call back  

## 2014-03-27 NOTE — Progress Notes (Signed)
Please close encounter when done.

## 2014-08-30 ENCOUNTER — Encounter: Payer: Self-pay | Admitting: *Deleted

## 2014-10-17 ENCOUNTER — Encounter: Payer: Self-pay | Admitting: Certified Nurse Midwife

## 2016-02-07 HISTORY — PX: THORACIC AORTIC ANEURYSM REPAIR: SHX799

## 2017-12-07 HISTORY — PX: LUNG LOBECTOMY: SHX167

## 2017-12-07 HISTORY — PX: ESOPHAGOGASTRODUODENOSCOPY: SHX1529

## 2019-05-16 ENCOUNTER — Emergency Department (HOSPITAL_COMMUNITY): Payer: Medicare PPO

## 2019-05-16 ENCOUNTER — Emergency Department (HOSPITAL_COMMUNITY)
Admission: EM | Admit: 2019-05-16 | Discharge: 2019-05-16 | Disposition: A | Discharge status: Home / Self Care | Payer: Medicare PPO | Attending: Emergency Medicine | Admitting: Emergency Medicine

## 2019-05-16 ENCOUNTER — Other Ambulatory Visit: Payer: Self-pay

## 2019-05-16 ENCOUNTER — Encounter (HOSPITAL_COMMUNITY): Payer: Self-pay | Admitting: *Deleted

## 2019-05-16 DIAGNOSIS — D649 Anemia, unspecified: Secondary | ICD-10-CM

## 2019-05-16 DIAGNOSIS — Z79899 Other long term (current) drug therapy: Secondary | ICD-10-CM | POA: Diagnosis not present

## 2019-05-16 DIAGNOSIS — Z87891 Personal history of nicotine dependence: Secondary | ICD-10-CM | POA: Insufficient documentation

## 2019-05-16 DIAGNOSIS — R002 Palpitations: Secondary | ICD-10-CM | POA: Diagnosis present

## 2019-05-16 DIAGNOSIS — R42 Dizziness and giddiness: Secondary | ICD-10-CM | POA: Diagnosis not present

## 2019-05-16 LAB — CBC
HCT: 32 % — ABNORMAL LOW (ref 36.0–46.0)
Hemoglobin: 8.6 g/dL — ABNORMAL LOW (ref 12.0–15.0)
MCH: 18.8 pg — ABNORMAL LOW (ref 26.0–34.0)
MCHC: 26.9 g/dL — ABNORMAL LOW (ref 30.0–36.0)
MCV: 70 fL — ABNORMAL LOW (ref 80.0–100.0)
Platelets: 234 10*3/uL (ref 150–400)
RBC: 4.57 MIL/uL (ref 3.87–5.11)
RDW: 21.6 % — ABNORMAL HIGH (ref 11.5–15.5)
WBC: 8.8 10*3/uL (ref 4.0–10.5)
nRBC: 0 % (ref 0.0–0.2)

## 2019-05-16 LAB — BASIC METABOLIC PANEL
Anion gap: 10 (ref 5–15)
BUN: 10 mg/dL (ref 8–23)
CO2: 24 mmol/L (ref 22–32)
Calcium: 9.3 mg/dL (ref 8.9–10.3)
Chloride: 102 mmol/L (ref 98–111)
Creatinine, Ser: 0.73 mg/dL (ref 0.44–1.00)
GFR calc Af Amer: >60 mL/min (ref >60)
GFR calc non Af Amer: >60 mL/min (ref >60)
Glucose, Bld: 86 mg/dL (ref 70–99)
Potassium: 3.8 mmol/L (ref 3.5–5.1)
Sodium: 136 mmol/L (ref 135–145)

## 2019-05-16 LAB — TROPONIN I (HIGH SENSITIVITY)
Troponin I (High Sensitivity): 5 ng/L (ref <18)
Troponin I (High Sensitivity): 7 ng/L (ref <18)

## 2019-05-16 MED ORDER — OXYMETAZOLINE HCL 0.05 % NA SOLN
1.0000 | Freq: Once | NASAL | Status: AC
  Administered 2019-05-16: 1 via NASAL
  Filled 2019-05-16: qty 30

## 2019-05-16 NOTE — ED Triage Notes (Signed)
C/o irregular heart rate for over a month getting worse. States the last time this happened she needed a transfusion due to anemia. States she has multiple problems

## 2019-05-16 NOTE — ED Provider Notes (Signed)
[facility] Provider Note   CSN: [id number] Arrival date & time: 05/16/19  1525     History Chief Complaint  Patient presents with  . Palpitations    Catherine Nichols is a 64 y.o. female.  64yo female with past medical history of thoracic aneurysm with repair x2, anemia, with complaint of heart palpitations since last week, feels slightly dizzy when this happens. Patient reports similar symptoms in January, was seen at [facility] in the ER with similar symptoms, found to be anemic and was given a transfusion with hgb 6.8.  Patient denies shortness of breath, chest pain, dark or tarry stools.  Patient states that while she was at [facility] she had a stool test that was negative for blood and does not know why she is anemic however states that she does crave ice.  No other complaints or concerns.         Past Medical History:  Diagnosis Date  . Fibroid   . Heart murmur   . STD (sexually transmitted disease)    trich    Patient Active Problem List   Diagnosis Date Noted  . SHOULDER PAIN, RIGHT, CHRONIC 06/03/2009  . NECK PAIN 06/03/2009  . NICOTINE ADDICTION 05/06/2009  . GERD 05/06/2009  . LEG PAIN, BILATERAL 05/06/2009  . CERVICAL MUSCLE STRAIN 05/06/2009    Past Surgical History:  Procedure Laterality Date  . ABDOMINAL HYSTERECTOMY    . bladder repair for prolapse  20 years ago  . CARDIAC SURGERY    . LUNG SURGERY    . TUBAL LIGATION       OB History    Gravida  1   Para  1   Term  1   Preterm      AB      Living  1     SAB      TAB      Ectopic      Multiple      Live Births  1           Family History  Problem Relation Age of Onset  . Alzheimer's disease Mother   . Prostate cancer Father     Social History   Tobacco Use  . Smoking status: Former Research scientist (life sciences)  . Smokeless tobacco: Never Used  . Tobacco comment: vape  Substance Use Topics  . Alcohol use: No    Alcohol/week: 0.0 standard drinks  . Drug use: No     Home Medications Prior to Admission medications   Medication Sig Start Date End Date Taking? Authorizing Provider  acetaminophen (TYLENOL) 500 MG tablet Take 500 mg by mouth every 8 (eight) hours as needed for mild pain or moderate pain.    Yes [provider]  ALPRAZolam Duanne Moron) 0.5 MG tablet Take 0.5-1 mg by mouth 2 (two) times daily as needed for anxiety.  04/20/19  Yes [provider]  amitriptyline (ELAVIL) 50 MG tablet Take 50 mg by mouth at bedtime as needed for sleep.  04/20/19  Yes [provider]  esomeprazole (NEXIUM) 40 MG capsule Take 40 mg by mouth daily. 04/20/19  Yes [provider]  fexofenadine (ALLEGRA) 180 MG tablet Take 180 mg by mouth daily. 04/20/19  Yes [provider]  furosemide (LASIX) 20 MG tablet Take 20 mg by mouth daily as needed for fluid.  05/03/19  Yes [provider]  ipratropium-albuterol (DUONEB) 0.5-2.5 (3) MG/3ML SOLN Inhale 3 mLs into the lungs every 6 (six) hours as needed (for  shortness of breath).  01/04/18  Yes [provider]  metoprolol tartrate (LOPRESSOR) 50 MG tablet Take 50 mg by mouth every 12 (twelve) hours. 04/18/19  Yes [provider]    Allergies    Aspirin  Review of Systems   Review of Systems  Constitutional: Negative for fever.  Respiratory: Negative for shortness of breath.   Cardiovascular: Positive for palpitations. Negative for chest pain.  Gastrointestinal: Negative for abdominal pain, anal bleeding, blood in stool, constipation, diarrhea, nausea and vomiting.  Musculoskeletal: Negative for arthralgias and myalgias.  Skin: Negative for rash and wound.  Allergic/Immunologic: Negative for immunocompromised state.  Neurological: Positive for light-headedness. Negative for weakness.  Hematological: Negative for adenopathy. Does not bruise/bleed easily.  Psychiatric/Behavioral: Negative for confusion.  All other systems reviewed and are negative.   Physical  Exam Updated Vital Signs BP (!) 157/49   Pulse 68   Temp 98.1 F (36.7 C) (Oral)   Resp 17   LMP 03/09/1998   SpO2 100%   Physical Exam Vitals and nursing note reviewed.  Constitutional:      General: She is not in acute distress.    Appearance: She is well-developed. She is not diaphoretic.  HENT:     Head: Normocephalic and atraumatic.     Mouth/Throat:     Mouth: Mucous membranes are moist.     Comments: Mucous membranes pale Eyes:     Comments: Conjunctiva pale  Cardiovascular:     Rate and Rhythm: Normal rate and regular rhythm.     Pulses: Normal pulses.     Heart sounds: Normal heart sounds.  Pulmonary:     Effort: Pulmonary effort is normal.     Breath sounds: Normal breath sounds.  Abdominal:     Palpations: Abdomen is soft.     Tenderness: There is no abdominal tenderness.  Musculoskeletal:     Right lower leg: No edema.     Left lower leg: No edema.  Skin:    General: Skin is warm and dry.     Findings: No erythema or rash.  Neurological:     Mental Status: She is alert and oriented to person, place, and time.  Psychiatric:        Behavior: Behavior normal.     ED Results / Procedures / Treatments   Labs (all labs ordered are listed, but only abnormal results are displayed) Labs Reviewed  CBC - Abnormal; Notable for the following components:      Result Value   Hemoglobin 8.6 (*)    HCT 32.0 (*)    MCV 70.0 (*)    MCH 18.8 (*)    MCHC 26.9 (*)    RDW 21.6 (*)    All other components within normal limits  BASIC METABOLIC PANEL  TROPONIN I (HIGH SENSITIVITY)  TROPONIN I (HIGH SENSITIVITY)    EKG EKG Interpretation  Date/Time:  Tuesday May 16 2019 15:43:02 EST Ventricular Rate:  78 PR Interval:  152 QRS Duration: 86 QT Interval:  376 QTC Calculation: 428 R Axis:   -47 Text Interpretation: Normal sinus rhythm Left axis deviation Inferior-posterior infarct , age undetermined Abnormal ECG No STEMI Confirmed by Nanda Quinton [phone]) on  05/16/2019 3:59:10 PM   Radiology DG Chest Port 1 View  Result Date: 05/16/2019 CLINICAL DATA:  Palpitation EXAM: PORTABLE CHEST 1 VIEW COMPARISON:  None. FINDINGS: Post sternotomy changes. Mild elevation of right diaphragm. No consolidation, pleural effusion, or pneumothorax. Borderline cardiomegaly. IMPRESSION: No active disease.  Borderline cardiomegaly. Electronically  Signed   By: Donavan Foil M.D.   On: 05/16/2019 17:12    Procedures Procedures (including critical care time)  Medications Ordered in ED Medications  oxymetazoline (AFRIN) 0.05 % nasal spray 1 spray (1 spray Each Nare Given 05/16/19 1718)    ED Course  I have reviewed the triage vital signs and the nursing notes.  Pertinent labs & imaging results that were available during my care of the patient were reviewed by me and considered in my medical decision making (see chart for details).  Clinical Course as of May 16 2027  Tue May 16, 6450  [id number] 64 year old female presents with complaint of palpitations, states last time she felt this way she was anemic and needed a blood transfusion.  Reports feeling little lightheaded otherwise is not short of breath, denies chest pain, abdominal pain, pedal edema, bloody stools or other complaints or concerns today. On exam found to have clear lung sounds with heart rate regular rate and rhythm, verified on EKG and monitor.  CBC with hemoglobin of 8.6, reviewed and compared with records from [facility], patient was admitted with hemoglobin of 6.8 at that time, discharged with hemoglobin in the sevens.  Initial troponin of 5, repeat is 7, patient without complaints of chest pain today, do not suspect ACS.  BMP is unremarkable.  Plan is for patient to follow-up with PCP, recommend she take a iron supplement, eat food high in iron such as spinach/green vegetables, return to ER for new or worsening symptoms.   [LM]    Clinical Course User Index [LM] Roque Lias   MDM  Rules/Calculators/A&P                      Final Clinical Impression(s) / ED Diagnoses Final diagnoses:  Palpitations  Anemia, unspecified type    Rx / DC Orders ED Discharge Orders    None       Roque Lias 05/16/19 2029    Margette Fast, MD 05/18/19 1926

## 2019-05-17 ENCOUNTER — Encounter: Payer: Self-pay | Admitting: *Deleted

## 2019-07-05 ENCOUNTER — Other Ambulatory Visit: Payer: Self-pay

## 2019-07-05 ENCOUNTER — Ambulatory Visit (INDEPENDENT_AMBULATORY_CARE_PROVIDER_SITE_OTHER): Payer: Self-pay | Admitting: *Deleted

## 2019-07-05 DIAGNOSIS — Z1211 Encounter for screening for malignant neoplasm of colon: Secondary | ICD-10-CM

## 2019-07-05 NOTE — Progress Notes (Signed)
Started triage form.  Pt informed me that she was having constipation, bm's and gas with foul smelling odor, and gurgling in her stomach.  Informed pt that we would need to schedule her for an ov first to address issues and ensure good bowel prep.  Pt voiced understanding and made ov for 07/10/2019 with [facility].

## 2019-07-05 NOTE — Progress Notes (Signed)
Gastroenterology Pre-Procedure Review  Request Date: 07/05/2019 Requesting Physician:   PATIENT REVIEW QUESTIONS: The patient responded to the following health history questions as indicated:    1. Diabetes Melitis: no 2. Joint replacements in the past 12 months: no 3. Major health problems in the past 3 months: no 4. Has an artificial valve or MVP:  5. Has a defibrillator:  6. Has been advised in past to take antibiotics in advance of a procedure like teeth cleaning:  7. Family history of colon cancer:   8. Alcohol Use:  9. Illicit drug Use:  10. History of sleep apnea:   11. History of coronary artery or other vascular stents placed within the last 12 months:  12. History of any prior anesthesia complications:  13. There is no height or weight on file to calculate BMI.    MEDICATIONS & ALLERGIES:    Patient reports the following regarding taking any blood thinners:   Plavix?  Aspirin?  Coumadin?  Brilinta?  Xarelto?  Eliquis?  Pradaxa?  Savaysa?  Effient?   Patient confirms/reports the following medications:  Current Outpatient Medications  Medication Sig Dispense Refill  . acetaminophen (TYLENOL) 500 MG tablet Take 500 mg by mouth as needed for mild pain or moderate pain.     Marland Kitchen ALPRAZolam (XANAX) 0.5 MG tablet Take 0.5-1 mg by mouth daily.     Marland Kitchen escitalopram (LEXAPRO) 10 MG tablet Take 10 mg by mouth at bedtime.    Marland Kitchen esomeprazole (NEXIUM) 40 MG capsule Take 40 mg by mouth daily.    . ferrous sulfate 325 (65 FE) MG tablet Take 325 mg by mouth daily with breakfast.    . fexofenadine (ALLEGRA) 180 MG tablet Take 180 mg by mouth daily.    . furosemide (LASIX) 20 MG tablet Take 20 mg by mouth daily as needed for fluid.     Marland Kitchen ipratropium-albuterol (DUONEB) 0.5-2.5 (3) MG/3ML SOLN Inhale 3 mLs into the lungs as needed (for shortness of breath).     . metoprolol tartrate (LOPRESSOR) 50 MG tablet Take 50 mg by mouth every 12 (twelve) hours.     No current facility-administered  medications for this visit.    Patient confirms/reports the following allergies:  Allergies  Allergen Reactions  . Aspirin Nausea Only    No orders of the defined types were placed in this encounter.   AUTHORIZATION INFORMATION Primary Insurance:,  ID #:,  Group #: Pre-Cert / Auth required:  Pre-Cert / Auth #:   SCHEDULE INFORMATION: Procedure has been scheduled as follows:  Date: , Time: Location:   This Gastroenterology Pre-Precedure Review Form is being routed to the following provider(s): Walden Field, NP

## 2019-07-06 NOTE — Progress Notes (Signed)
Noted  

## 2019-07-06 NOTE — Progress Notes (Signed)
Will need OV due to meds

## 2019-07-08 ENCOUNTER — Encounter: Payer: Self-pay | Admitting: Gastroenterology

## 2019-07-08 NOTE — Progress Notes (Signed)
Referring Provider: Rossie Muskrat, DO Primary Care Physician:  Rossie Muskrat, DO Primary Gastroenterologist:  Dr. Gala Romney  Chief Complaint  Patient presents with  . Colonoscopy    last tcs approx 8 yrs ago in [place]  . Constipation    occ diarrhea, states she has IBS; foul odor; stomach makes gurgling sounds    HPI:   Catherine Nichols is a 64 y.o. female presenting today at the request of Rossie Muskrat, DO for colonoscopy. Initially nurse triage, but changed to OV due to constipation/gas.   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   Today:  Last colonoscopy about 8 years ago in [place]. No polyps. States she had IBS. Bowels fluctuate from diarrhea to constipation. Has been losing weight. Lost a lot of weight after surgeries in 2018 and 2019. States she has continued losing weight over the last few months but is unable to quantify this.  States her clothes are getting loose. Trying to eat. Has been eating a lot of ice.   Reviewed recent weights with PCP: 04/20/2019: 61.34 kg 10/11/2018: 63.1 kg 08/16/2018: 62.7 kg 05/10/2018: 62.7 kg 02/08/2018: 59.8 kg  Today: 61.3 kg. (Fairly stable over the last year- 3 lb weight loss)  Having a BM daily. Eating rasins to help with bowel regularity. Taking dulcolax fairly frequently for the last 2 months if she feels like she is getting "backed up."  Daily to every couple of days. Last week she had diarrhea for 3 days. Stools were loose to watery and very dark (dark green to black). Started probiotic last week and thinks this is may have caused the diarrhea.  She has since discontinued probiotic. This week stools have been formed.  States she is having back aches like she was when she was told she was constipated in the past. When moving her bowels, this helps her lower back discomfort. Never taken anything else for  constipation.   Has a lot "boiling in her stomach."  Fruit, greens, and vegetables worsen diarrhea. Then states diarrhea is rare. No recent antibiotics. She does consume a lot of fruits and vegetables. Cheese may cause constipation. Drinks milk regularly in cereal. This doesn't worsen diarrhea.   Alternating bowel habits for years. Was having a lot of diarrhea at the time of her last colonoscopy requiring OTC antidiarrhea pills.  Not taking these now. Symptoms improved for a while but started having trouble with her bowels again about 5 months ago. Symptoms started more with constipation.    No brbpr. No abdominal pain. Associated urgency with formed stools or with diarrhea. Stools don't look oily. Odor is terrible which started about 1 year ago. Also with a lot of gas. No nausea or vomiting. No GERD symptoms on esomeprazole. No dysphagia or odynophagia.   No hx of pancreatitis. No alcohol.   Has taken pepto bismol tablets in the past. None in the last week. Started taking iron 2-3 weeks ago. Had samples from PCP. Last took this on Thursday of last week.  States she sees her PCP this afternoon and will have iron refilled.  She has chronic anemia and does not know why. No regular NSAID use.   Has heart palpitations. No chest pain. Some SOB with exertion. No SOB at rest. No cough. Chronic trouble with her sinuses. No fever. Admits to chills.  No presyncope or syncope.  1st cousin passed from colon cancer at 59.  Chronic Anemia- Hg 10.13 February 2010. Hg 10-11 range in 2018, 8-9 range typically in 2019.  Hg 8.6 May 16, 2019 s/p PRBCs January 2021 at [facility] with Hg 6.8. DRE at [facility] with occult blood negative.    Past Medical History:  Diagnosis Date  . Aortic stenosis    s/p AVR  . Fibroid   . GERD (gastroesophageal reflux disease)   . Heart failure with preserved ejection fraction ([place])   . Heart murmur   . HTN (hypertension)   . IBS (irritable bowel syndrome)   . PAD (peripheral artery  disease) ([place])   . 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 STD (sexually transmitted disease)    trich    Past Surgical History:  Procedure Laterality Date  . ABDOMINAL HYSTERECTOMY    . AORTIC VALVE REPLACEMENT    . bladder repair for prolapse  20 years ago  . CARDIAC SURGERY    . ESOPHAGOGASTRODUODENOSCOPY  12/2017   [facility]; Surgeon: Don Perking, MD; significant esophagitis treated with Fluconazole, PPI, and Carafate  . LUNG LOBECTOMY Right 12/2017   for SCC  . LUNG SURGERY    . THORACIC AORTIC ANEURYSM REPAIR     x2  . TUBAL LIGATION      Current Outpatient Medications  Medication Sig Dispense Refill  . acetaminophen (TYLENOL) 500 MG tablet Take 500 mg by mouth as needed for mild pain or moderate pain.     Marland Kitchen ALPRAZolam (XANAX) 0.5 MG tablet Take 0.5-1 mg by mouth daily.     Marland Kitchen escitalopram (LEXAPRO) 10 MG tablet Take 10 mg by mouth at bedtime.    Marland Kitchen esomeprazole (NEXIUM) 40 MG capsule Take 40 mg by mouth daily.    . ferrous sulfate 325 (65 FE) MG tablet Take 325 mg by mouth daily with breakfast.    . fexofenadine (ALLEGRA) 180 MG tablet Take 180 mg by mouth as needed.     . furosemide (LASIX) 20 MG tablet Take 20 mg by mouth daily as needed for fluid.     . metoprolol tartrate (LOPRESSOR) 50 MG tablet Take 50 mg by mouth every 12 (twelve) hours.    Marland Kitchen ipratropium-albuterol (DUONEB) 0.5-2.5 (3) MG/3ML SOLN Inhale 3 mLs into the lungs as needed (for shortness of breath).      No current facility-administered medications for this visit.    Allergies as of 07/10/2019 - Review Complete 07/10/2019  Allergen Reaction Noted  . Aspirin Nausea Only 02/21/2014    Family History  Problem Relation Age of Onset  . Alzheimer's disease Mother   . Prostate cancer Father   . Colon cancer Cousin        Passsed at age 66    Social History   Socioeconomic History  . Marital status: Divorced    Spouse name: Not on file  . Number  of children: Not on file  . Years of education: Not on file  . Highest education level: Not on file  Occupational History  . Not on file  Tobacco Use  . Smoking status: Former Research scientist (life sciences)  . Smokeless tobacco: Never Used  . Tobacco comment: vape  Substance and Sexual Activity  . Alcohol use: No    Alcohol/week: 0.0 standard drinks  . Drug use: No  . Sexual activity: Never    Partners: Male  Other Topics Concern  . Not on file  Social History Narrative  .  Not on file   Social Determinants of Health   Financial Resource Strain:   . Difficulty of Paying Living Expenses:   Food Insecurity:   . Worried About Charity fundraiser in the Last Year:   . Arboriculturist in the Last Year:   Transportation Needs:   . Film/video editor (Medical):   Marland Kitchen Lack of Transportation (Non-Medical):   Physical Activity:   . Days of Exercise per Week:   . Minutes of Exercise per Session:   Stress:   . Feeling of Stress :   Social Connections:   . Frequency of Communication with Friends and Family:   . Frequency of Social Gatherings with Friends and Family:   . Attends Religious Services:   . Active Member of Clubs or Organizations:   . Attends Archivist Meetings:   Marland Kitchen Marital Status:   Intimate Partner Violence:   . Fear of Current or Ex-Partner:   . Emotionally Abused:   Marland Kitchen Physically Abused:   . Sexually Abused:     Review of Systems: Gen: See HPI CV: See HPI Resp: See HPI GI: See HPI GU : Denies urinary burning, urinary frequency, urinary hesitancy Derm: Denies rash Heme: See HPI  Physical Exam: BP (!) 159/65   Pulse 67   Temp (!) 96.8 F (36 C) (Temporal)   Ht 5\' 6"  (1.676 m)   Wt 135 lb 3.2 oz (61.3 kg)   LMP 03/09/1998   BMI 21.82 kg/m  General:   Alert and oriented. Pleasant and cooperative. Well-nourished and well-developed.  Head:  Normocephalic and atraumatic. Eyes:  Without icterus, sclera clear and conjunctiva pink.  Ears:  Normal auditory  acuity. Lungs:  Clear to auscultation bilaterally. No wheezes, rales, or rhonchi. No distress.  Heart:  S1, S2 present without murmurs appreciated Abdomen:  +BS, soft, non-tender and non-distended. No HSM noted. No guarding or rebound. No masses appreciated.    Rectal:  Deferred  Msk:  Symmetrical without gross deformities. Normal posture. Extremities:  Without edema. Neurologic:  Alert and  oriented x4;  grossly normal neurologically. Skin:  Intact without significant lesions or rashes. Psych: Normal mood and affect.

## 2019-07-10 ENCOUNTER — Encounter: Payer: Self-pay | Admitting: Gastroenterology

## 2019-07-10 ENCOUNTER — Other Ambulatory Visit: Payer: Self-pay

## 2019-07-10 ENCOUNTER — Telehealth: Payer: Self-pay | Admitting: Gastroenterology

## 2019-07-10 ENCOUNTER — Ambulatory Visit (INDEPENDENT_AMBULATORY_CARE_PROVIDER_SITE_OTHER): Payer: Medicare PPO | Admitting: Gastroenterology

## 2019-07-10 VITALS — BP 159/65 | HR 67 | Temp 96.8°F | Ht 66.0 in | Wt 135.2 lb

## 2019-07-10 DIAGNOSIS — K5900 Constipation, unspecified: Secondary | ICD-10-CM

## 2019-07-10 DIAGNOSIS — D649 Anemia, unspecified: Secondary | ICD-10-CM | POA: Diagnosis not present

## 2019-07-10 DIAGNOSIS — Z1211 Encounter for screening for malignant neoplasm of colon: Secondary | ICD-10-CM

## 2019-07-10 NOTE — Patient Instructions (Addendum)
We will get you scheduled for a colonoscopy and possible upper endoscopy in the near future with Dr. Gala Romney. You need to hold iron for 7 days prior to your procedure.  We will request recent labs completed with Dr. Nevada Crane.  Once I receive these, I will place additional orders needed.  Please have abdominal x-ray completed.  This is to evaluate for underlying stool burden in your colon.  Regarding constipation, I recommend you stop dulcolax and start MiraLAX 1 capful (17 g) daily in 8 ounces of water.  You may decrease the frequency if you develop frequent loose stools.  For gas and bloating:  Avoid items that can cause gas/bloating including broccoli, cabbage, cauliflower, Brussels sprouts, soda, carbonated beverages, drinking through a straw, artificial sweetener, and chewing gum. Follow a lactose-free diet for now or take Lactaid tablets prior to consuming any dairy products. Follow a low-fat diet.  All meats should be lean (poultry or fish) and should be baked, boiled, or broiled.  We will plan to see you back after your procedures.  Aliene Altes, PA-C [facility]   Abdominal Bloating When you have abdominal bloating, your abdomen may feel full, tight, or painful. It may also look bigger than normal or swollen (distended). Common causes of abdominal bloating include:  Swallowing air.  Constipation.  Problems digesting food.  Eating too much.  Irritable bowel syndrome. This is a condition that affects the large intestine.  Lactose intolerance. This is an inability to digest lactose, a natural sugar in dairy products.  Celiac disease. This is a condition that affects the ability to digest gluten, a protein found in some grains.  Gastroparesis. This is a condition that slows down the movement of food in the stomach and small intestine. It is more common in people with diabetes mellitus.  Gastroesophageal reflux disease (GERD). This is a digestive condition that  makes stomach acid flow back into the esophagus.  Urinary retention. This means that the body is holding onto urine, and the bladder cannot be emptied all the way. Follow these instructions at home: Eating and drinking  Avoid eating too much.  Try not to swallow air while talking or eating.  Avoid eating while lying down.  Avoid these foods and drinks: ? Foods that cause gas, such as broccoli, cabbage, cauliflower, and baked beans. ? Carbonated drinks. ? Hard candy. ? Chewing gum. Medicines  Take over-the-counter and prescription medicines only as told by your health care provider.  Take probiotic medicines. These medicines contain live bacteria or yeasts that can help digestion.  Take coated peppermint oil capsules. Activity  Try to exercise regularly. Exercise may help to relieve bloating that is caused by gas and relieve constipation. General instructions  Keep all follow-up visits as told by your health care provider. This is important. Contact a health care provider if:  You have nausea and vomiting.  You have diarrhea.  You have abdominal pain.  You have unusual weight loss or weight gain.  You have severe pain, and medicines do not help. Get help right away if:  You have severe chest pain.  You have trouble breathing.  You have shortness of breath.  You have trouble urinating.  You have darker urine than normal.  You have blood in your stools or have dark, tarry stools. Summary  Abdominal bloating means that the abdomen is swollen.  Common causes of abdominal bloating are swallowing air, constipation, and problems digesting food.  Avoid eating too much and avoid swallowing air.  Avoid foods that cause gas, carbonated drinks, hard candy, and chewing gum. This information is not intended to replace advice given to you by your health care provider. Make sure you discuss any questions you have with your health care provider. Document Revised:  06/13/2018 Document Reviewed: 03/27/2016 Elsevier Patient Education  [place].

## 2019-07-10 NOTE — Telephone Encounter (Signed)
Catherine Nichols, I have placed the order for DG Abdomen. Order wasn't placed before patient left the office. Please let patient know the order has been placed and she can walk in to [facility] to have this completed.   She will go to [facility] and let them know she is there for an X-ray. They will direct her to radiology.

## 2019-07-10 NOTE — Telephone Encounter (Signed)
Noted. Left a detailed message for pt. Pt can call back if needed.

## 2019-07-11 ENCOUNTER — Encounter: Payer: Self-pay | Admitting: Gastroenterology

## 2019-07-11 ENCOUNTER — Telehealth: Payer: Self-pay | Admitting: Gastroenterology

## 2019-07-11 NOTE — Assessment & Plan Note (Addendum)
64 year old female reports history of IBS with diarrhea several years ago.  Symptoms had resolved for a while and she is now currently dealing with alternating bowel habits x5 months.  Typically with constipation requiring Dulcolax every couple of days and occasional diarrhea.  Also reports increase in gas and bloating.  Denies abdominal pain.  Admits to consuming large amount of fruits and vegetables and milk regularly.  Stools were recently dark green/black appearing after starting iron.  No bright red blood per rectum.  Subjectively, patient reports weight loss but upon review of weights over the last year from PCP, weight appears fairly stable with total of 3 pound weight loss.  Per patient, last colonoscopy in [place] about 8 years ago at the time of IBS diagnosis.  Denies polyps.  Reports family history of first cousin with colon cancer at age 87.  Additionally, she has been found to have chronic anemia of unknown source recently requiring blood transfusion in Jan. 2021 at [facility].  Stool was heme-negative at that time.  Notably, she does have microcytic indices.  Unclear etiology of change in bowel habits.  This may be idiopathic.  Cannot rule out malignancy in the setting of worsening anemia.  Suspect increased gas/bloating may be secondary to dietary choices and underlying constipation. She could also have SIBO. Not entirely consistent with celiac disease, but this is also in the differential. Rare diarrhea may be overflow diarrhea or secondary to Dulcolax. We will start with trying to improve constipation and eliminating common causes of gas/bloating as well as update colonoscopy.   Plan: Stop Dulcolax and start MiraLAX 1 capful (17 g) daily in 8 ounces of water. Abdominal x-ray two-view to evaluate for stool burden as patient is concerned about significant constipation contributing to her back pain. Avoid items that can cause gas/bloating including broccoli, cabbage, cauliflower, Brussels sprouts,  soda, carbonated beverages, drinking through a straw, artificial sweetener, and chewing gum.  Handout provided. Follow a lactose-free diet for now or take Lactaid tablets prior to consuming any dairy products. Follow a low-fat diet.  All meats should be lean (poultry or fish) and should be baked, boiled, or broiled. Proceed with colonoscopy with propofol with Dr. Gala Romney in the near future. She will also have possible EGD for anemia as discussed above. The risks, benefits, and alternatives have been discussed in detail with patient. They have stated understanding and desire to proceed.  Plan follow-up after procedures.  Advised to call with questions or concerns prior.

## 2019-07-11 NOTE — Assessment & Plan Note (Addendum)
64 year old female with history of chronic anemia of unidentified source.  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 Hg 10.13 February 2010. Hg 10-11 range in 2018, 8-9 range typically in 2019, and found to have hemoglobin 6.8 in January 2021 requiring blood transfusion at [facility].  She was occult blood negative at that time.  Most recent hemoglobin 8.6 in March 2021 with microcytic indices.  No iron panel, B12, or folate on file.  Kidney function within normal limits.  Denies bright red blood per rectum.  Only recently with dark green/black stool after starting iron.  No NSAIDs. No significant upper GI symptoms on esomeprazole daily.  Lower GI symptoms include change in bowel habits x5 months with alternating constipation/diarrhea (more constipation than diarrhea) and increased gas and bloating.  She reports last colonoscopy about 8 years ago in [place] without polyps.  EGD at [facility] in 2019 with significant esophagitis.  Family history with first cousin passing from colon cancer at age 69.  Etiology of anemia is not clear.  She reports recent labs with PCP checking her iron.  I do not have these to review today.  Would also recommend checking B12 and folate and checking celiac serologies. Patient is unsure of labs ordered by PCP and requested I reviewed these records first.  I also feel we need to go ahead and update her colonoscopy and consider evaluating her upper GI tract as well for occult blood loss considering microcytic indices and likely iron deficiency.    Request recent labs from PCP. Proceed with TCS +/- EGD with propofol with Dr. Gala Romney in the near future. The risks, benefits, and alternatives have been discussed in detail with patient. They have stated understanding and desire to proceed.  Plan follow-up after procedures.  Advise she call with questions or concerns  prior.

## 2019-07-11 NOTE — Assessment & Plan Note (Addendum)
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  Patient reports last colonoscopy was about 8 years ago in [place].  No polyps.  Family history with first cousin passing from colon cancer at age 59.  She denies bright red blood per rectum.  Recently had dark green/black stool after starting iron.  Subjectively reports weight loss but per review of weights with PCP, weight has been fairly stable over the last year with 3 pound weight loss.  Notably, she had worsening of anemia with hemoglobin 6.8 requiring blood transfusion in January 2021 at [facility].  Most recent hemoglobin 8.6 with microcytic indices on 05/16/2019.  No iron panel on file.  Patient reports this was completed with PCP.  Not sure if patient is due for repeat colonoscopy at this time.  However, considering change in bowel habits as well as worsening anemia, I feel the need to go ahead and update colonoscopy and consider evaluating her upper GI tract with EGD.   Proceed with TCS +/- EGD with propofol with Dr. Gala Romney in the near future. The risks, benefits, and alternatives have been discussed in detail with patient. They have stated understanding and desire to proceed.  Further management of constipation and gas/bloating discussed below. Requests recent labs completed with PCP. Plan follow-up after procedures.  Advise she call with questions or concerns prior.

## 2019-07-11 NOTE — Telephone Encounter (Signed)
Received and reviewed labs completed by PCP 06/26/19. CBC: WBC 15.1 (H), hemoglobin 9.0 (L), MCV 67 (L), MCH 18.9 (L), MCHC 28.2 (L), platelets 237 Iron panel: Iron 20 (L), iron saturation 5% (L), TIBC 444 CMP: Glucose 80, creatinine 0.77, sodium 135, potassium 4.9, calcium 9.9, albumin 4.5, total bilirubin 0.5, alk phos 91, AST 25, ALT 9 Lipid panel: Total cholesterol 110, triglycerides 43, HDL 62, LDL 37  Alicia: Please let patient know I have received and reviewed her recent labs completed with PCP. Hemoglobin was slightly improved compared to 1 month ago.  She does have iron deficiency anemia.  Ferritin was not completed.  I recommend obtaining ferritin, vitamin B12, folate to help evaluate her anemia.  I also recommend obtaining labs to evaluate for celiac disease including IgA total and TTG IgA.  Please arrange.  Additionally, please verify that she is taking oral iron. At her office visit, she stated she would have this refilled by PCP yesterday afternoon.

## 2019-07-11 NOTE — Telephone Encounter (Signed)
Lmom, waiting on a return call.  

## 2019-07-12 ENCOUNTER — Other Ambulatory Visit: Payer: Self-pay

## 2019-07-12 ENCOUNTER — Ambulatory Visit (HOSPITAL_COMMUNITY)
Admission: RE | Admit: 2019-07-12 | Discharge: 2019-07-12 | Disposition: A | Discharge status: Home / Self Care | Payer: Medicare PPO | Source: Ambulatory Visit | Attending: Gastroenterology | Admitting: Gastroenterology

## 2019-07-12 ENCOUNTER — Other Ambulatory Visit (HOSPITAL_COMMUNITY)
Admission: RE | Admit: 2019-07-12 | Discharge: 2019-07-12 | Disposition: A | Discharge status: Home / Self Care | Payer: Medicare PPO | Source: Ambulatory Visit | Attending: Gastroenterology | Admitting: Gastroenterology

## 2019-07-12 DIAGNOSIS — D509 Iron deficiency anemia, unspecified: Secondary | ICD-10-CM

## 2019-07-12 DIAGNOSIS — K5900 Constipation, unspecified: Secondary | ICD-10-CM | POA: Diagnosis not present

## 2019-07-12 LAB — VITAMIN B12: Vitamin B-12: 350 pg/mL (ref 180–914)

## 2019-07-12 LAB — FERRITIN: Ferritin: 13 ng/mL (ref 11–307)

## 2019-07-12 NOTE — Progress Notes (Signed)
Abdominal x-ray reveals she does have a moderate amount of stool in her colon.  This is consistent with her report of constipation.  She should proceed with recommendations made at the time of office visit.  Call in 2-4 weeks if MiraLAX is not helping with constipation.

## 2019-07-12 NOTE — Telephone Encounter (Signed)
Noted. Will review lab results as they become available.

## 2019-07-12 NOTE — Telephone Encounter (Signed)
Pt returned call. Pt is aware of results. New lab orders added and released today. Pt will have labs done at [facility] today. Lab orders were faxed to [facility].   Pt was given some iron samples by her doctor that she took and pt is picking up her RX iron that her doctor called in. Pt isn't sure of the dosage and hasn't been taking the iron samples long.

## 2019-07-12 NOTE — Telephone Encounter (Signed)
VM received. Called pt and no VM was available. Will call pt back.

## 2019-07-13 LAB — FOLATE RBC
Folate, Hemolysate: 452 ng/mL
Folate, RBC: 1421 ng/mL
Hematocrit: 31.8 % — ABNORMAL LOW (ref 34.0–46.6)

## 2019-07-13 LAB — IGA: IgA: 493 mg/dL — ABNORMAL HIGH (ref 87–352)

## 2019-07-14 LAB — TISSUE TRANSGLUTAMINASE, IGA: Tissue Transglutaminase Ab, IgA: <2 U/mL (ref 0–3)

## 2019-07-16 ENCOUNTER — Telehealth: Payer: Self-pay | Admitting: Gastroenterology

## 2019-07-16 ENCOUNTER — Encounter: Payer: Self-pay | Admitting: Gastroenterology

## 2019-07-16 NOTE — Telephone Encounter (Signed)
Received and reviewed colonoscopy and EGD report dated 01/12/2013 with Dr. West Carbo.  Procedures were performed for evaluation of diarrhea.  Colonoscopy: Normal colonoscopy s/p biopsy to rule out microscopic colitis.  Pathology benign. EGD: Normal exam s/p biopsy of duodenum to rule out celiac disease.  Pathology with increased intraepithelial lymphocytes.  States this could be an early indication of celiac disease but blunted villi are not seen.  We recently completed celiac screen with TTG IgA and IgA total which was negative.  She is also scheduled for TCS and EGD for evaluation of IDA which will also help confirm negative celiac screen. Clinically, she is more on the constipated side at this time but does report intermittent diarrhea. Also with increased gas/bloating and may very well benefit from trying glutin free diet.   Elmo Putt, please let patient know I have reviewed prior TCS and EGD reports. Duodenal biopsies with findings that could suggest early celiac disease. We completed labs to screen for celiac that were negative; however, she may try a gluten free diet to see if this helps with her gas/bloating in addition to prior recommendations.

## 2019-07-17 NOTE — Telephone Encounter (Signed)
Also will discuss lab results pt.

## 2019-07-17 NOTE — Telephone Encounter (Signed)
Lmom, waiting on a return call.  

## 2019-07-17 NOTE — Telephone Encounter (Signed)
Spoke with pt. Pt was notified of results and [place] recommendations. Pt is going to research celiac to follow dietary changes.

## 2019-08-29 ENCOUNTER — Other Ambulatory Visit: Payer: Self-pay | Admitting: Obstetrics and Gynecology

## 2019-08-29 DIAGNOSIS — E2839 Other primary ovarian failure: Secondary | ICD-10-CM

## 2019-09-06 ENCOUNTER — Telehealth: Payer: Self-pay

## 2019-09-06 NOTE — Telephone Encounter (Signed)
PA for TCS/EGD submitted via [employer] website. EGD approved. [employer]# [id number], valid 10/16/19-11/15/19. TCS went to clinical review. Clinical notes faxed to [facility]. [place] tracking# [id number].

## 2019-09-06 NOTE — Telephone Encounter (Signed)
Pre-op/covid test 10/13/19 at 12:45pm. Letter mailed with procedure instructions.

## 2019-09-06 NOTE — Telephone Encounter (Signed)
Called pt to schedule TCS/-/+EGD w/Prop w/RMR. Pt wants to ask her son which date is best. She will call office back.

## 2019-09-06 NOTE — Telephone Encounter (Signed)
Pt called office, procedure scheduled for 10/16/19 at 9:00am. Orders entered.

## 2019-09-07 ENCOUNTER — Other Ambulatory Visit: Payer: Self-pay

## 2019-09-07 NOTE — Telephone Encounter (Signed)
TCS approved. [employer]# [id number], valid 10/16/19-11/15/19.

## 2019-10-06 ENCOUNTER — Encounter: Payer: Self-pay | Admitting: *Deleted

## 2019-10-09 ENCOUNTER — Ambulatory Visit (INDEPENDENT_AMBULATORY_CARE_PROVIDER_SITE_OTHER): Payer: Medicare PPO

## 2019-10-09 ENCOUNTER — Ambulatory Visit (INDEPENDENT_AMBULATORY_CARE_PROVIDER_SITE_OTHER): Payer: Medicare PPO | Admitting: Cardiology

## 2019-10-09 ENCOUNTER — Encounter: Payer: Self-pay | Admitting: Cardiology

## 2019-10-09 ENCOUNTER — Other Ambulatory Visit: Payer: Self-pay

## 2019-10-09 ENCOUNTER — Telehealth: Payer: Self-pay | Admitting: Cardiology

## 2019-10-09 VITALS — BP 140/60 | HR 64 | Ht 66.0 in | Wt 135.4 lb

## 2019-10-09 DIAGNOSIS — Z8679 Personal history of other diseases of the circulatory system: Secondary | ICD-10-CM | POA: Diagnosis not present

## 2019-10-09 DIAGNOSIS — I359 Nonrheumatic aortic valve disorder, unspecified: Secondary | ICD-10-CM

## 2019-10-09 DIAGNOSIS — Z9889 Other specified postprocedural states: Secondary | ICD-10-CM

## 2019-10-09 DIAGNOSIS — R002 Palpitations: Secondary | ICD-10-CM

## 2019-10-09 NOTE — Patient Instructions (Addendum)
Medication Instructions:   Your physician recommends that you continue on your current medications as directed. Please refer to the Current Medication list given to you today.  Labwork:  NONE  Testing/Procedures: Your physician has requested that you have an echocardiogram. Echocardiography is a painless test that uses sound waves to create images of your heart. It provides your doctor with information about the size and shape of your heart and how well your heart's chambers and valves are working. This procedure takes approximately one hour. There are no restrictions for this procedure. Your physician has recommended that you wear a long term monitor for 3 days. These monitors are medical devices that record the heart's electrical activity. Doctors most often use these monitors to diagnose arrhythmias. Arrhythmias are problems with the speed or rhythm of the heartbeat. The monitor is a small, portable device. You can wear one while you do your normal daily activities. This is usually used to diagnose what is causing palpitations/syncope (passing out). [employer] will contact you about this monitor.  Follow-Up:  Your physician recommends that you schedule a follow-up appointment in: pending.  Any Other Special Instructions Will Be Listed Below (If Applicable).  If you need a refill on your cardiac medications before your next appointment, please call your pharmacy.

## 2019-10-09 NOTE — Telephone Encounter (Signed)
Pre-cert Verification for the following procedure     72 hour ZIO AT dx: palpitations

## 2019-10-09 NOTE — Progress Notes (Signed)
Cardiology Office Note  Date: 10/09/2019   ID: Catherine Nichols, DOB [birth year], MRN [id number]  PCP:  Volney American, FNP  Cardiologist:  Rozann Lesches, MD Electrophysiologist:  None   Chief Complaint  Patient presents with  . Palpitations    History of Present Illness: Catherine Nichols is a 64 y.o. female referred for cardiology consultation by Ms. Hamel NP with Dr. Nevada Crane for the evaluation of palpitations.  I reviewed extensive records and updated the chart.  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  She was also found to have a right upper lobe pulmonary nodule and ultimately underwent robot-assisted right upper lobectomy in October 2019. I see that she did have a CT of the chest, abdomen, and pelvis done at [facility] back in January, results discussed below.  She tells me that she is pending colonoscopy and upper endoscopy next week.  Describes possible reflux symptoms, bad taste in her mouth, also having trouble with recurring constipation.  In terms of palpitations she states that this has been a long-term recurring symptom for her.  She feels a sense of heart rate irregularity, does tend to be worse when she is constipated.  No associated syncope or chest pain.  I reviewed her previous tracing from March as outlined below.   Past Medical History:  Diagnosis Date  . Aortic regurgitation    Status post aortic valve repair with a #5mm HAART 300 aortic valve ring annuloplasty December 2018 - [facility]  . Ascending aortic aneurysm [facility])    Status post ascending aortic and total arch replacement with a #8mm Vascutek [place] multibranch arch graft  December 2018 - [facility]  . Essential hypertension   . GERD (gastroesophageal reflux disease)   . IBS (irritable bowel syndrome)   . Infection due to trichomonas   . PAD (peripheral artery disease) ([place])   . Pseudoaneurysm of aortic arch [facility])    Status post repair August 2019 - [facility]  . 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 Uterine fibroid     Past Surgical History:  Procedure Laterality Date  . ABDOMINAL HYSTERECTOMY    . AORTIC VALVE REPAIR    . Bladder repair for prolapse  20 years ago  . COLONOSCOPY  01/12/2013   Dr. West Carbo; Normal colonoscopy s/p biopsy to rule out microscopic colitis.  Pathology benign.  . ESOPHAGOGASTRODUODENOSCOPY  12/2017   [facility]; Surgeon: Don Perking, MD; significant esophagitis treated with Fluconazole, PPI, and Carafate  . ESOPHAGOGASTRODUODENOSCOPY  01/12/2013   Dr. West Carbo; Normal exam s/p biopsy of duodenum to rule out celiac disease.  Pathology with increased intraepithelial lymphocytes.  States this could be an early indication of celiac disease but blunted villi are not seen.  Marland Kitchen LUNG LOBECTOMY Right 12/2017   for SCC  . THORACIC AORTIC ANEURYSM REPAIR     x2  . TUBAL LIGATION      Current Outpatient Medications  Medication Sig Dispense Refill  . acetaminophen (TYLENOL) 500 MG tablet Take 500 mg by mouth every 6 (six) hours as needed for mild pain or moderate pain.     Marland Kitchen ALPRAZolam Duanne Moron)  0.5 MG tablet Take 0.5-1 mg by mouth daily as needed for anxiety.     Marland Kitchen Apoaequorin (PREVAGEN) 10 MG CAPS Take by mouth.    . esomeprazole (NEXIUM) 40 MG capsule Take 40 mg by mouth daily.    . fexofenadine (ALLEGRA) 180 MG tablet Take 180 mg by mouth at bedtime as needed for allergies.     . furosemide (LASIX) 20 MG tablet Take 20 mg by mouth daily as needed for fluid.     . Magnesium Hydroxide (DULCOLAX SOFT CHEWS) 1200 MG CHEW Chew by mouth.    . metoprolol tartrate (LOPRESSOR) 50 MG  tablet Take 50 mg by mouth 2 (two) times daily.     . Probiotic Product (PROBIOTIC DAILY PO) Take 1 capsule by mouth daily.     No current facility-administered medications for this visit.   Allergies:  Aspirin   Social History: The patient  reports that she has quit smoking. Her smoking use included cigarettes. She has never used smokeless tobacco. She reports that she does not drink alcohol and does not use drugs.   Family History: The patient's family history includes Alzheimer's disease in her mother; Colon cancer in her cousin; Prostate cancer in her father.   ROS:   No syncope.  Physical Exam: VS:  BP (!) 140/60   Pulse 64   Ht 5\' 6"  (1.676 m)   Wt 135 lb 6.4 oz (61.4 kg)   LMP 03/09/1998   SpO2 98%   BMI 21.85 kg/m , BMI Body mass index is 21.85 kg/m.  Wt Readings from Last 3 Encounters:  10/09/19 135 lb 6.4 oz (61.4 kg)  07/10/19 135 lb 3.2 oz (61.3 kg)  02/21/14 152 lb (68.9 kg)    General: Patient appears comfortable at rest. HEENT: Conjunctiva and lids normal, wearing a mask. Neck: Supple, no elevated JVP, left carotid bruit, no thyromegaly. Lungs: Decreased breath sounds without wheezing, nonlabored breathing at rest. Cardiac: Regular rate and rhythm, no S3, 1-1/9 systolic and 1-4/7 diastolic murmurs, no pericardial rub. Thorax: Healed midline sternal incision. Abdomen: Soft, nontender, bowel sounds present, no guarding or rebound. Extremities: No pitting edema, distal pulses 2+. Skin: Warm and dry. Musculoskeletal: No kyphosis. Neuropsychiatric: Alert and oriented x3, affect grossly appropriate.  ECG:  An ECG dated 05/16/2019 was personally reviewed today and demonstrated:  Sinus rhythm, rule out old inferior infarct pattern versus left anterior fascicular block.  Recent Labwork: 05/16/2019: BUN 10; Creatinine, Ser 0.73; Hemoglobin 8.6; Platelets 234; Potassium 3.8; Sodium 136  April 2021: Hemoglobin 9.0, platelets 237, BUN 10, creatinine 0.77, potassium 4.9, AST  25, ALT 9, cholesterol 110, triglycerides 43, HDL 62, LDL 37  Other Studies Reviewed Today:  CTA chest/abdomen/pelvis 03/17/2019 ([facility]): Comparison: CT chest angiogram 12/17/2017   Technique: TAA protocol, Helical non ECG gated single source. Volumetric  pre and post contrasted, arterial only, images through the chest, abdomen  and pelvis were obtained. This study was acquired following the IV  administration of iodinated contrast material, given the patient's  indications for the examination. If IV contrast material had not been  administered, the likelihood of detecting abnormalities relevant to the  patient's condition would have been substantially decreased.   Complex image reconstruction post processing was likewise performed as  indicated to increase the sensitivity of detecting clinically relevant  pathology. Evaluation of solid organs maybe limited due dedicated imaging  technique for vascular processes as per indication for examination.   Findings:  CTA Chest:  Status post aortic valve replacement and  ascending aortic graft placement  repair. The graft is intact with expected postsurgical changes. No  dissection, aneurysm, IMH, or pseudoaneurysm of the thoracic aorta. There  is no significant change in the size, patency or appearance of the  ascending thoracic aorta compared to recent prior. Systemic vascular wall  to wall cross sectional measurements:   Ascending thoracic aorta at the Sinus of Valsalva: 3.3 x 3.0 x 3.1 cm  Maximum dimension of tubular ascending thoracic aorta: 3.4 x 3.1 cm  Descending thoracic aorta at diaphragmatic hiatus: 3.1 x 3.1 cm   The bilateral common carotid, vertebral and subclavian arteries are patent  and without atherosclerotic calcification or luminal narrowing.   CTA Abdomen and pelvis:  Scattered atherosclerotic calcification of the abdominal aorta without  luminal narrowing or aneurysmal dilation. The proximal portions of the    celiac, SMA, IMA and bilateral renal arteries are patent and without high  grade atherosclerotic calcification. Incidentally noted replaced left  hepatic artery. The bilateral common iliac, external iliac, and common  femoral, and proximal SFA and profunda arteries are patent with minimal  atherosclerotic calcification.   CT Chest, Abdomen and Pelvis:  Thyroid: Unremarkable.  Lymph nodes: Redemonstrated prominent mediastinal lymph nodes, likely  reactive.  Lungs:Central airways patent. Status post right upper lobectomy. Scattered  atelectasis. No pleural effusion.  Heart: Stable biatrial enlargement. No appreciable coronary artery  calcifications. No pericardial effusion.   Liver: Normal contour.  Gallbladder/Biliary Tree: Unremarkable. No bile duct dilation.  Spleen: : Unremarkable.  Pancreas: Unremarkable.  Adrenals: Unremarkable.  Kidneys: Unremarkable. No hydronephrosis.  Bladder: Unremarkable.   GI Tract: Scattered diverticulosis.   Peritoneum/Mesentery/Retroperitoneum: No pathologic adenopathy. No  pneumoperitoneum. No ascites.  Soft Tissues/Bones: Soft tissues are unremarkable. Stable degenerative  changes of the spine. No aggressive osseous lesion.   Impression:  1. Stable postsurgical changes of aortic valve replacement and ascending  aortic graft placement repair with graft intact and no evidence of  pseudoaneurysm. There is no significant change in the size, patency or  appearance of the ascending thoracic aorta compared to recent prior.  2. No evidence of abdominal aortic aneurysm.   Echocardiogram 12/16/2017 ([facility]): ECHOCARDIOGRAPHIC DESCRIPTIONS -----------------------------------------------  AORTIC ROOT     Size: Not seen  Dissection: INDETERM FOR DISSECTION    AO Note: AORTIC GRAFT S/P REPAIR   AORTIC VALVE   Leaflets: Tricuspid       Morphology: Normal   Mobility: Fully Mobile   AOV Note: 64mm HAART 300 AoV RING   LEFT  VENTRICLE                  Anterior: Normal     Size: SMALL                 Lateral: Normal  Contraction: Normal                 Septal: Normal  Closest EF: >55% (Estimated)            Apical: Normal   LV masses: No Masses               Inferior: Normal      LVH: MILD LVH               Posterior: Normal  Dias.FxClass: N/A   MITRAL VALVE   Leaflets: Normal         Mobility: Fully mobile  Morphology: Normal   LEFT ATRIUM     Size: Normal   LA masses: No masses  Normal IAS   MAIN PA     Size: MILDLY DILATED   PULMONIC VALVE  Morphology: Normal   Mobility: Fully Mobile    PV Note: PAAT = 32ms   RIGHT VENTRICLE     Size: MILDLY ENLARGED      Free wall: HYPOCONTRACTILE  Contraction: MILD GLOBAL DECREASE   RV masses: No Masses     TAPSE:  1.4 cm, Normal Range [>= 1.6 cm]    RV Note: TV SYSTOLIC ANNULAR VELOCITY = 10cm/s   TRICUSPID VALVE   Leaflets: Normal         Mobility: Fully mobile  Morphology: Normal   RIGHT ATRIUM     Size: MILDLY ENLARGED      RA Other: None   RA masses: No masses   PERICARDIUM     Fluid: No effusion   INFERIOR VENACAVA    Size: Normal   ABNORMAL RESPIRATORY COLLAPSE   DOPPLER ECHO and OTHER SPECIAL PROCEDURES ------------------------------------   Aortic: MILD AR        OTHER PROSTHETIC AoV   3.2 m/s peak vel  41 mmHg peak grad 20 mmHg mean grad  Resting LVOT Vel: 1.0 m/s. Dimensionless Index: 0.35    Mitral: TRIVIAL MR       No MS   MV Inflow E Vel.= nm* cm/s MV Annulus E'Vel.= nm* cm/s E/E'Ratio= nm*   Tricuspid: MODERATE TR      No TS       4.0 m/s peak TR vel  72 mmHgpeak RV pressure   Pulmonary: MILD PR        No PS    Other:   INTERPRETATION  ---------------------------------------------------------------  NORMAL LEFT VENTRICULAR SYSTOLIC FUNCTION WITH MILD LVH  MILD RV SYSTOLIC DYSFUNCTION (See above)  VALVULAR REGURGITATION: MILD AR, TRIVIAL MR, MILD PR, MODERATE TR  -S/P REDO ASCENDING AORTIC ANEURYSM REPAIR (10/2017) FOLLOWING  PSEUDOANEURYSM AFTER 02/2017 REPAIR  -MODERATE TO SEVERE TR  -HYPERDYNAMIC LV   Compared with prior Echo study on 09/29/2017: S/P REDO ASCENDING AORTIC  ANEURYSM REPAIR (10/2017) FOLLOW PSEUDOANEURYSM AFTER 02/2017 REPAIR  RV NOW MILDLY DILATED AND HYPOCONTRACTILE WITH MODERATE TO SEVERE TR AND  ESTIMATED RVSP OF 19mmHg   Assessment and Plan:  1.  Reportedly longstanding, recurrent history of palpitations.  No obvious arrhythmia based on review of the chart.  She does not describe any associated syncope.  Sinus rhythm noted by tracing in March.  Plan will be to obtain a 72-hour cardiac monitor for further investigation.  2.  History of complex cardiac surgery at [facility] as outlined above.  She was initially diagnosed with aortic regurgitation and an ascending thoracic aortic aneurysm status post repair in 2018, then repair of a pseudoaneurysm in 2019.  Prominent systolic and diastolic murmurs noted today.  Her last echocardiogram was at [facility] in 2019.  Follow-up study will be obtained.  Chest CT from January at [facility] describes stable postsurgical changes of both aortic valve and aortic graft.  3.  Right upper lobe pulmonary nodule status post lobectomy at [facility] in 2019, also discussed above.  Chest CT from January at [facility] did not report any new findings status post lobectomy.  Medication Adjustments/Labs and Tests Ordered: Current medicines are reviewed at length with the patient today.  Concerns regarding medicines are outlined above.   Tests Ordered: Orders Placed This Encounter  Procedures  . LONG TERM MONITOR (3-14 DAYS)  . ECHOCARDIOGRAM COMPLETE    Medication Changes: No  orders of the defined types were placed  in this encounter.   Disposition:  Follow up test results and determine disposition.  Signed, Satira Sark, MD, [facility] 10/09/2019 11:05 AM    [facility] [place], [place], [place] [address] Phone: [phone]; Fax: [phone]

## 2019-10-10 ENCOUNTER — Telehealth: Payer: Self-pay

## 2019-10-10 NOTE — Telephone Encounter (Signed)
Noted. Prevagen will not interfere with procedures.

## 2019-10-10 NOTE — Telephone Encounter (Signed)
Pt is aware.  

## 2019-10-10 NOTE — Telephone Encounter (Signed)
Called pt to remind her to stop taking her iron prior to her procedure. Pt stated she had already stopped taking the iron because she felt like it was causing some of her constipation.  FYI- Pt wanted me to let Cyril Mourning know that she has started taking prevagen. She said it was an otc memory vitamin.

## 2019-10-12 ENCOUNTER — Telehealth: Payer: Self-pay | Admitting: Cardiology

## 2019-10-12 NOTE — Telephone Encounter (Signed)
Patient called stating that she is having issues with her heart monitor.

## 2019-10-12 NOTE — Anesthesia Preprocedure Evaluation (Addendum)
Anesthesia Evaluation  Patient identified by MRN, date of birth, ID band Patient awake    Reviewed: Allergy & Precautions, H&P , NPO status , Patient's Chart, lab work & pertinent test results, reviewed documented beta blocker date and time   Airway Mallampati: II  TM Distance: >3 FB Neck ROM: full    Dental no notable dental hx. (+) Edentulous Lower, Edentulous Upper   Pulmonary neg pulmonary ROS, former smoker,    Pulmonary exam normal breath sounds clear to auscultation       Cardiovascular Exercise Tolerance: Good hypertension, + Peripheral Vascular Disease   Rhythm:regular Rate:Normal     Neuro/Psych negative neurological ROS  negative psych ROS   GI/Hepatic Neg liver ROS, GERD  Medicated,  Endo/Other  negative endocrine ROS  Renal/GU negative Renal ROS  negative genitourinary   Musculoskeletal   Abdominal   Peds  Hematology  (+) Blood dyscrasia, anemia ,   Anesthesia Other Findings   Reproductive/Obstetrics negative OB ROS                            Anesthesia Physical Anesthesia Plan  ASA: III  Anesthesia Plan: General   Post-op Pain Management:    Induction:   PONV Risk Score and Plan: 3 and Propofol infusion  Airway Management Planned:   Additional Equipment:   Intra-op Plan:   Post-operative Plan:   Informed Consent: I have reviewed the patients History and Physical, chart, labs and discussed the procedure including the risks, benefits and alternatives for the proposed anesthesia with the patient or authorized representative who has indicated his/her understanding and acceptance.     Dental Advisory Given  Plan Discussed with: CRNA  Anesthesia Plan Comments:         Anesthesia Quick Evaluation

## 2019-10-12 NOTE — Telephone Encounter (Signed)
Reports that she did not press the button on the monitor while wearing it. Advised that its not required and we will still receive results. Verbalized understanding.

## 2019-10-13 ENCOUNTER — Encounter (HOSPITAL_COMMUNITY)
Admission: RE | Admit: 2019-10-13 | Discharge: 2019-10-13 | Disposition: A | Discharge status: Home / Self Care | Payer: Medicare PPO | Source: Ambulatory Visit | Attending: Internal Medicine | Admitting: Internal Medicine

## 2019-10-13 ENCOUNTER — Other Ambulatory Visit (HOSPITAL_COMMUNITY)
Admission: RE | Admit: 2019-10-13 | Discharge: 2019-10-13 | Disposition: A | Discharge status: Home / Self Care | Payer: Medicare PPO | Source: Ambulatory Visit | Attending: Internal Medicine | Admitting: Internal Medicine

## 2019-10-13 ENCOUNTER — Encounter (HOSPITAL_COMMUNITY): Payer: Self-pay

## 2019-10-13 ENCOUNTER — Other Ambulatory Visit: Payer: Self-pay

## 2019-10-13 DIAGNOSIS — Z01812 Encounter for preprocedural laboratory examination: Secondary | ICD-10-CM | POA: Insufficient documentation

## 2019-10-13 DIAGNOSIS — Z20822 Contact with and (suspected) exposure to covid-19: Secondary | ICD-10-CM | POA: Insufficient documentation

## 2019-10-13 LAB — CBC WITH DIFFERENTIAL/PLATELET
Abs Immature Granulocytes: 0.01 10*3/uL (ref 0.00–0.07)
Basophils Absolute: 0 10*3/uL (ref 0.0–0.1)
Basophils Relative: 0 %
Eosinophils Absolute: 0.1 10*3/uL (ref 0.0–0.5)
Eosinophils Relative: 1 %
HCT: 40.9 % (ref 36.0–46.0)
Hemoglobin: 12.5 g/dL (ref 12.0–15.0)
Immature Granulocytes: 0 %
Lymphocytes Relative: 24 %
Lymphs Abs: 1.6 10*3/uL (ref 0.7–4.0)
MCH: 25.8 pg — ABNORMAL LOW (ref 26.0–34.0)
MCHC: 30.6 g/dL (ref 30.0–36.0)
MCV: 84.5 fL (ref 80.0–100.0)
Monocytes Absolute: 0.4 10*3/uL (ref 0.1–1.0)
Monocytes Relative: 5 %
Neutro Abs: 4.8 10*3/uL (ref 1.7–7.7)
Neutrophils Relative %: 70 %
Platelets: 146 10*3/uL — ABNORMAL LOW (ref 150–400)
RBC: 4.84 MIL/uL (ref 3.87–5.11)
RDW: 16.8 % — ABNORMAL HIGH (ref 11.5–15.5)
WBC: 6.8 10*3/uL (ref 4.0–10.5)
nRBC: 0 % (ref 0.0–0.2)

## 2019-10-13 LAB — BASIC METABOLIC PANEL
Anion gap: 10 (ref 5–15)
BUN: 14 mg/dL (ref 8–23)
CO2: 24 mmol/L (ref 22–32)
Calcium: 9.3 mg/dL (ref 8.9–10.3)
Chloride: 104 mmol/L (ref 98–111)
Creatinine, Ser: 0.59 mg/dL (ref 0.44–1.00)
GFR calc Af Amer: >60 mL/min (ref >60)
GFR calc non Af Amer: >60 mL/min (ref >60)
Glucose, Bld: 76 mg/dL (ref 70–99)
Potassium: 4.2 mmol/L (ref 3.5–5.1)
Sodium: 138 mmol/L (ref 135–145)

## 2019-10-13 NOTE — Patient Instructions (Signed)
Catherine Nichols  10/13/2019     @PREFPERIOPPHARMACY @   Your procedure is scheduled on 10/16/2019.  Report to [facility] at 7:30 A.M.  Call this number if you have problems the morning of surgery:  [phone]   Remember:  Do not eat or drink after midnight.   Please follow the instructions for the prep given to you by Dr Roseanne Kaufman office    Take these medicines the morning of surgery with A SIP OF WATER : Xanax, Nexium, Metoprolol, Prevagen    Do not wear jewelry, make-up or nail polish.  Do not wear lotions, powders, or perfumes, or deodorant.  Do not shave 48 hours prior to surgery.  Men may shave face and neck.  Do not bring valuables to the hospital.  [facility] is not responsible for any belongings or valuables.  Contacts, dentures or bridgework may not be worn into surgery.  Leave your suitcase in the car.  After surgery it may be brought to your room.  For patients admitted to the hospital, discharge time will be determined by your treatment team.  Patients discharged the day of surgery will not be allowed to drive home.   Name and phone number of your driver:  FamilySpecial instructions:  none  Please read over the following fact sheets that you were given. Care and Recovery After Surgery    Upper Endoscopy, Adult Upper endoscopy is a procedure to look inside the upper GI (gastrointestinal) tract. The upper GI tract is made up of:  The part of the body that moves food from your mouth to your stomach (esophagus).  The stomach.  The first part of your small intestine (duodenum). This procedure is also called esophagogastroduodenoscopy (EGD) or gastroscopy. In this procedure, your health care provider passes a thin, flexible tube (endoscope) through your mouth and down your esophagus into your stomach. A small camera is attached to the end of the tube. Images from the camera appear on a monitor in the exam room. During this procedure, your health care provider may  also remove a small piece of tissue to be sent to a lab and examined under a microscope (biopsy). Your health care provider may do an upper endoscopy to diagnose cancers of the upper GI tract. You may also have this procedure to find the cause of other conditions, such as:  Stomach pain.  Heartburn.  Pain or problems when swallowing.  Nausea and vomiting.  Stomach bleeding.  Stomach ulcers. Tell a health care provider about:  Any allergies you have.  All medicines you are taking, including vitamins, herbs, eye drops, creams, and over-the-counter medicines.  Any problems you or family members have had with anesthetic medicines.  Any blood disorders you have.  Any surgeries you have had.  Any medical conditions you have.  Whether you are pregnant or may be pregnant. What are the risks? Generally, this is a safe procedure. However, problems may occur, including:  Infection.  Bleeding.  Allergic reactions to medicines.  A tear or hole (perforation) in the esophagus, stomach, or duodenum. What happens before the procedure? Staying hydrated Follow instructions from your health care provider about hydration, which may include:  Up to 2 hours before the procedure - you may continue to drink clear liquids, such as water, clear fruit juice, black coffee, and plain tea.  Eating and drinking restrictions Follow instructions from your health care provider about eating and drinking, which may include:  8 hours before the procedure - stop eating  heavy meals or foods, such as meat, fried foods, or fatty foods.  6 hours before the procedure - stop eating light meals or foods, such as toast or cereal.  6 hours before the procedure - stop drinking milk or drinks that contain milk.  2 hours before the procedure - stop drinking clear liquids. Medicines Ask your health care provider about:  Changing or stopping your regular medicines. This is especially important if you are taking  diabetes medicines or blood thinners.  Taking medicines such as aspirin and ibuprofen. These medicines can thin your blood. Do not take these medicines unless your health care provider tells you to take them.  Taking over-the-counter medicines, vitamins, herbs, and supplements. General instructions  Plan to have someone take you home from the hospital or clinic.  If you will be going home right after the procedure, plan to have someone with you for 24 hours.  Ask your health care provider what steps will be taken to help prevent infection. What happens during the procedure?   An IV will be inserted into one of your veins.  You may be given one or more of the following: ? A medicine to help you relax (sedative). ? A medicine to numb the throat (local anesthetic).  You will lie on your left side on an exam table.  Your health care provider will pass the endoscope through your mouth and down your esophagus.  Your health care provider will use the scope to check the inside of your esophagus, stomach, and duodenum. Biopsies may be taken.  The endoscope will be removed. The procedure may vary among health care providers and hospitals. What happens after the procedure?  Your blood pressure, heart rate, breathing rate, and blood oxygen level will be monitored until you leave the hospital or clinic.  Do not drive for 24 hours if you were given a sedative during your procedure.  When your throat is no longer numb, you may be given some fluids to drink.  It is up to you to get the results of your procedure. Ask your health care provider, or the department that is doing the procedure, when your results will be ready. Summary  Upper endoscopy is a procedure to look inside the upper GI tract.  During the procedure, an IV will be inserted into one of your veins. You may be given a medicine to help you relax.  A medicine will be used to numb your throat.  The endoscope will be passed  through your mouth and down your esophagus. This information is not intended to replace advice given to you by your health care provider. Make sure you discuss any questions you have with your health care provider. Document Revised: 08/18/2017 Document Reviewed: 07/26/2017 Elsevier Patient Education  [place].  Colonoscopy, Adult A colonoscopy is a procedure to look at the entire large intestine. This procedure is done using a long, thin, flexible tube that has a camera on the end. You may have a colonoscopy:  As a part of normal colorectal screening.  If you have certain symptoms, such as: ? A low number of red blood cells in your blood (anemia). ? Diarrhea that does not go away. ? Pain in your abdomen. ? Blood in your stool. A colonoscopy can help screen for and diagnose medical problems, including:  Tumors.  Extra tissue that grows where mucus forms (polyps).  Inflammation.  Areas of bleeding. Tell your health care provider about:  Any allergies you have.  All medicines you are taking, including vitamins, herbs, eye drops, creams, and over-the-counter medicines.  Any problems you or family members have had with anesthetic medicines.  Any blood disorders you have.  Any surgeries you have had.  Any medical conditions you have.  Any problems you have had with having bowel movements.  Whether you are pregnant or may be pregnant. What are the risks? Generally, this is a safe procedure. However, problems may occur, including:  Bleeding.  Damage to your intestine.  Allergic reactions to medicines given during the procedure.  Infection. This is rare. What happens before the procedure? Eating and drinking restrictions Follow instructions from your health care provider about eating or drinking restrictions, which may include:  A few days before the procedure: ? Follow a low-fiber diet. ? Avoid nuts, seeds, dried fruit, raw fruits, and vegetables.  1-3  days before the procedure: ? Eat only gelatin dessert or ice pops. ? Drink only clear liquids, such as water, clear juice, clear broth or bouillon, black coffee or tea, or clear soft drinks or sports drinks. ? Avoid liquids that contain red or purple dye.  The day of the procedure: ? Do not eat solid foods. You may continue to drink clear liquids until up to 2 hours before the procedure. ? Do not eat or drink anything starting 2 hours before the procedure, or within the time period that your health care provider recommends. Bowel prep If you were prescribed a bowel prep to take by mouth (orally) to clean out your colon:  Take it as told by your health care provider. Starting the day before your procedure, you will need to drink a large amount of liquid medicine. The liquid will cause you to have many bowel movements of loose stool until your stool becomes almost clear or light green.  If your skin or the opening between the buttocks (anus) gets irritated from diarrhea, you may relieve the irritation using: ? Wipes with medicine in them, such as adult wet wipes with aloe and vitamin E. ? A product to soothe skin, such as petroleum jelly.  If you vomit while drinking the bowel prep: ? Take a break for up to 60 minutes. ? Begin the bowel prep again. ? Call your health care provider if you keep vomiting or you cannot take the bowel prep without vomiting.  To clean out your colon, you may also be given: ? Laxative medicines. These help you have a bowel movement. ? Instructions for enema use. An enema is liquid medicine injected into your rectum. Medicines Ask your health care provider about:  Changing or stopping your regular medicines or supplements. This is especially important if you are taking iron supplements, diabetes medicines, or blood thinners.  Taking medicines such as aspirin and ibuprofen. These medicines can thin your blood. Do not take these medicines unless your health care  provider tells you to take them.  Taking over-the-counter medicines, vitamins, herbs, and supplements. General instructions  Ask your health care provider what steps will be taken to help prevent infection. These may include washing skin with a germ-killing soap.  Plan to have someone take you home from the hospital or clinic. What happens during the procedure?   An IV will be inserted into one of your veins.  You may be given one or more of the following: ? A medicine to help you relax (sedative). ? A medicine to numb the area (local anesthetic). ? A medicine to make you fall asleep (general  anesthetic). This is rarely needed.  You will lie on your side with your knees bent.  The tube will: ? Have oil or gel put on it (be lubricated). ? Be inserted into your anus. ? Be gently eased through all parts of your large intestine.  Air will be sent into your colon to keep it open. This may cause some pressure or cramping.  Images will be taken with the camera and will appear on a screen.  A small tissue sample may be removed to be looked at under a microscope (biopsy). The tissue may be sent to a lab for testing if any signs of problems are found.  If small polyps are found, they may be removed and checked for cancer cells.  When the procedure is finished, the tube will be removed. The procedure may vary among health care providers and hospitals. What happens after the procedure?  Your blood pressure, heart rate, breathing rate, and blood oxygen level will be monitored until you leave the hospital or clinic.  You may have a small amount of blood in your stool.  You may pass gas and have mild cramping or bloating in your abdomen. This is caused by the air that was used to open your colon during the exam.  Do not drive for 24 hours after the procedure.  It is up to you to get the results of your procedure. Ask your health care provider, or the department that is doing the  procedure, when your results will be ready. Summary  A colonoscopy is a procedure to look at the entire large intestine.  Follow instructions from your health care provider about eating and drinking before the procedure.  If you were prescribed an oral bowel prep to clean out your colon, take it as told by your health care provider.  During the colonoscopy, a flexible tube with a camera on its end is inserted into the anus and then passed into the other parts of the large intestine. This information is not intended to replace advice given to you by your health care provider. Make sure you discuss any questions you have with your health care provider. Document Revised: 09/16/2018 Document Reviewed: 09/16/2018 Elsevier Patient Education  [place].

## 2019-10-14 LAB — SARS CORONAVIRUS 2 (TAT 6-24 HRS): SARS Coronavirus 2: NEGATIVE

## 2019-10-16 ENCOUNTER — Encounter (HOSPITAL_COMMUNITY)
Admission: RE | Disposition: A | Discharge status: Home / Self Care | Payer: Self-pay | Source: Home / Self Care | Attending: Internal Medicine

## 2019-10-16 ENCOUNTER — Ambulatory Visit (HOSPITAL_COMMUNITY)
Admission: RE | Admit: 2019-10-16 | Discharge: 2019-10-16 | Disposition: A | Discharge status: Home / Self Care | Payer: Medicare PPO | Attending: Internal Medicine | Admitting: Internal Medicine

## 2019-10-16 ENCOUNTER — Ambulatory Visit (HOSPITAL_COMMUNITY): Payer: Medicare PPO | Admitting: Anesthesiology

## 2019-10-16 ENCOUNTER — Encounter (HOSPITAL_COMMUNITY): Payer: Self-pay | Admitting: Internal Medicine

## 2019-10-16 ENCOUNTER — Other Ambulatory Visit: Payer: Self-pay

## 2019-10-16 DIAGNOSIS — K5909 Other constipation: Secondary | ICD-10-CM | POA: Diagnosis not present

## 2019-10-16 DIAGNOSIS — R12 Heartburn: Secondary | ICD-10-CM | POA: Insufficient documentation

## 2019-10-16 DIAGNOSIS — K3189 Other diseases of stomach and duodenum: Secondary | ICD-10-CM | POA: Diagnosis not present

## 2019-10-16 DIAGNOSIS — K219 Gastro-esophageal reflux disease without esophagitis: Secondary | ICD-10-CM | POA: Insufficient documentation

## 2019-10-16 DIAGNOSIS — K5730 Diverticulosis of large intestine without perforation or abscess without bleeding: Secondary | ICD-10-CM | POA: Diagnosis not present

## 2019-10-16 DIAGNOSIS — Z85118 Personal history of other malignant neoplasm of bronchus and lung: Secondary | ICD-10-CM | POA: Insufficient documentation

## 2019-10-16 DIAGNOSIS — I10 Essential (primary) hypertension: Secondary | ICD-10-CM | POA: Insufficient documentation

## 2019-10-16 DIAGNOSIS — Z79899 Other long term (current) drug therapy: Secondary | ICD-10-CM | POA: Insufficient documentation

## 2019-10-16 DIAGNOSIS — Z87891 Personal history of nicotine dependence: Secondary | ICD-10-CM | POA: Diagnosis not present

## 2019-10-16 DIAGNOSIS — Z800 Family history of malignant neoplasm of digestive organs: Secondary | ICD-10-CM | POA: Diagnosis not present

## 2019-10-16 DIAGNOSIS — K2950 Unspecified chronic gastritis without bleeding: Secondary | ICD-10-CM | POA: Diagnosis not present

## 2019-10-16 DIAGNOSIS — K635 Polyp of colon: Secondary | ICD-10-CM | POA: Diagnosis not present

## 2019-10-16 DIAGNOSIS — I739 Peripheral vascular disease, unspecified: Secondary | ICD-10-CM | POA: Diagnosis not present

## 2019-10-16 DIAGNOSIS — D124 Benign neoplasm of descending colon: Secondary | ICD-10-CM | POA: Insufficient documentation

## 2019-10-16 DIAGNOSIS — Z902 Acquired absence of lung [part of]: Secondary | ICD-10-CM | POA: Insufficient documentation

## 2019-10-16 DIAGNOSIS — R194 Change in bowel habit: Secondary | ICD-10-CM | POA: Diagnosis present

## 2019-10-16 DIAGNOSIS — K317 Polyp of stomach and duodenum: Secondary | ICD-10-CM | POA: Diagnosis not present

## 2019-10-16 DIAGNOSIS — K5790 Diverticulosis of intestine, part unspecified, without perforation or abscess without bleeding: Secondary | ICD-10-CM | POA: Insufficient documentation

## 2019-10-16 DIAGNOSIS — D649 Anemia, unspecified: Secondary | ICD-10-CM | POA: Insufficient documentation

## 2019-10-16 HISTORY — PX: POLYPECTOMY: SHX5525

## 2019-10-16 HISTORY — PX: BIOPSY: SHX5522

## 2019-10-16 HISTORY — PX: COLONOSCOPY WITH PROPOFOL: SHX5780

## 2019-10-16 HISTORY — PX: ESOPHAGOGASTRODUODENOSCOPY (EGD) WITH PROPOFOL: SHX5813

## 2019-10-16 SURGERY — COLONOSCOPY WITH PROPOFOL
Anesthesia: General

## 2019-10-16 MED ORDER — GLYCOPYRROLATE 0.2 MG/ML IJ SOLN
INTRAMUSCULAR | Status: AC
  Filled 2019-10-16: qty 1

## 2019-10-16 MED ORDER — LIDOCAINE HCL (CARDIAC) PF 50 MG/5ML IV SOSY
PREFILLED_SYRINGE | INTRAVENOUS | Status: DC | PRN
  Administered 2019-10-16: 100 mg via INTRAVENOUS

## 2019-10-16 MED ORDER — PROPOFOL 10 MG/ML IV BOLUS
INTRAVENOUS | Status: AC
  Filled 2019-10-16: qty 40

## 2019-10-16 MED ORDER — STERILE WATER FOR IRRIGATION IR SOLN
Status: DC | PRN
  Administered 2019-10-16: 1.5 mL

## 2019-10-16 MED ORDER — LACTATED RINGERS IV SOLN: INTRAVENOUS | Status: DC

## 2019-10-16 MED ORDER — PROPOFOL 10 MG/ML IV BOLUS
INTRAVENOUS | Status: DC | PRN
  Administered 2019-10-16: 50 mg via INTRAVENOUS
  Administered 2019-10-16: 100 mg via INTRAVENOUS
  Administered 2019-10-16 (×2): 20 mg via INTRAVENOUS

## 2019-10-16 MED ORDER — LIDOCAINE VISCOUS HCL 2 % MT SOLN
OROMUCOSAL | Status: AC
  Filled 2019-10-16: qty 15

## 2019-10-16 MED ORDER — LIDOCAINE VISCOUS HCL 2 % MT SOLN
15.0000 mL | Freq: Once | OROMUCOSAL | Status: AC
  Administered 2019-10-16: 15 mL via OROMUCOSAL

## 2019-10-16 MED ORDER — CHLORHEXIDINE GLUCONATE CLOTH 2 % EX PADS: 6.0000 | MEDICATED_PAD | Freq: Once | CUTANEOUS | Status: DC

## 2019-10-16 MED ORDER — LIDOCAINE VISCOUS HCL 2 % MT SOLN
OROMUCOSAL | Status: DC | PRN
  Administered 2019-10-16: 3 mL via OROMUCOSAL

## 2019-10-16 MED ORDER — GLYCOPYRROLATE 0.2 MG/ML IJ SOLN
0.2000 mg | Freq: Once | INTRAMUSCULAR | Status: AC
  Administered 2019-10-16: 0.2 mg via INTRAVENOUS

## 2019-10-16 MED ORDER — PROPOFOL 500 MG/50ML IV EMUL
INTRAVENOUS | Status: DC | PRN
  Administered 2019-10-16 (×2): 75 ug/kg/min via INTRAVENOUS

## 2019-10-16 NOTE — Op Note (Signed)
[facility] Patient Name: Catherine Nichols Procedure Date: 10/16/2019 9:13 AM MRN: [id number] Date of Birth: 19-Jan-1956 Attending MD: Norvel Richards , MD CSN: [id number] Age: 64 Admit Type: Outpatient Procedure:                Upper GI endoscopy Indications:              Heartburn Providers:                Norvel Richards, MD, Charlsie Quest. Theda Sers RN, RN,                            Crystal Page, Nelma Rothman, Technician Referring MD:              Medicines:                Propofol per Anesthesia Complications:            No immediate complications. Estimated Blood Loss:     Estimated blood loss was minimal. Procedure:                Pre-Anesthesia Assessment:                           - Prior to the procedure, a History and Physical                            was performed, and patient medications and                            allergies were reviewed. The patient's tolerance of                            previous anesthesia was also reviewed. The risks                            and benefits of the procedure and the sedation                            options and risks were discussed with the patient.                            All questions were answered, and informed consent                            was obtained. Prior Anticoagulants: The patient has                            taken no previous anticoagulant or antiplatelet                            agents. ASA Grade Assessment: II - A patient with                            mild systemic disease. After reviewing the risks  and benefits, the patient was deemed in                            satisfactory condition to undergo the procedure.                           After obtaining informed consent, the endoscope was                            passed under direct vision. Throughout the                            procedure, the patient's blood pressure, pulse, and                            oxygen  saturations were monitored continuously. The                            GIF-H190 ([id number]) scope was introduced through the                            mouth, and advanced to the second part of duodenum.                            The upper GI endoscopy was accomplished without                            difficulty. The patient tolerated the procedure                            well. Scope In: 10:00:03 AM Scope Out: 10:04:34 AM Total Procedure Duration: 0 hours 4 minutes 31 seconds  Findings:      The examined esophagus was normal.      Patchy moderately erythematous mucosa without bleeding was found in the       entire examined stomach. No ulcer or infiltrating process seen. This was       biopsied with a cold forceps for histology. Estimated blood loss was       minimal.      The duodenal bulb and second portion of the duodenum were normal. Impression:               - Normal esophagus.                           - Erythematous mucosa in the stomach. Biopsied.                           - Normal duodenal bulb and second portion of the                            duodenum. Moderate Sedation:      Moderate (conscious) sedation was personally administered by an       anesthesia professional. The following parameters were monitored: oxygen       saturation, heart rate, blood pressure, respiratory rate, EKG, adequacy  of pulmonary ventilation, and response to care. Recommendation:           - Patient has a contact number available for                            emergencies. The signs and symptoms of potential                            delayed complications were discussed with the                            patient. Return to normal activities tomorrow.                            Written discharge instructions were provided to the                            patient.                           - Advance diet as tolerated.                           - Continue present medications.                            - Await pathology results.                           - Return to my office in 3 months. See colonoscopy                            report. Procedure Code(s):        --- Professional ---                           [phone], Esophagogastroduodenoscopy, flexible,                            transoral; with biopsy, single or multiple Diagnosis Code(s):        --- Professional ---                           K31.89, Other diseases of stomach and duodenum                           R12, Heartburn CPT copyright 2019 American Medical Association. All rights reserved. The codes documented in this report are preliminary and upon coder review may  be revised to meet current compliance requirements. Cristopher Estimable. Devaney Segers, MD Norvel Richards, MD 10/16/2019 10:08:55 AM This report has been signed electronically. Number of Addenda: 0

## 2019-10-16 NOTE — Anesthesia Postprocedure Evaluation (Signed)
Anesthesia Post Note  Patient: Catherine Nichols  Procedure(s) Performed: COLONOSCOPY WITH PROPOFOL (N/A ) ESOPHAGOGASTRODUODENOSCOPY (EGD) WITH PROPOFOL (N/A ) BIOPSY POLYPECTOMY  Patient location during evaluation: PACU Anesthesia Type: General Level of consciousness: patient cooperative and awake and alert Pain management: satisfactory to patient Vital Signs Assessment: post-procedure vital signs reviewed and stable Respiratory status: spontaneous breathing Cardiovascular status: stable Postop Assessment: no apparent nausea or vomiting Anesthetic complications: no   No complications documented.   Last Vitals:  Vitals:   10/16/19 1045 10/16/19 1100  BP: (!) 110/52 (!) 111/59  Pulse: 86 75  Resp: 19 (!) 21  Temp:    SpO2: 96% 98%    Last Pain:  Vitals:   10/16/19 1100  TempSrc:   PainSc: 0-No pain                 Riyaan Heroux

## 2019-10-16 NOTE — Transfer of Care (Signed)
Immediate Anesthesia Transfer of Care Note  Patient: Catherine Nichols  Procedure(s) Performed: COLONOSCOPY WITH PROPOFOL (N/A ) ESOPHAGOGASTRODUODENOSCOPY (EGD) WITH PROPOFOL (N/A ) BIOPSY POLYPECTOMY  Patient Location: PACU  Anesthesia Type:General  Level of Consciousness: sedated and patient cooperative  Airway & Oxygen Therapy: Patient Spontanous Breathing  Post-op Assessment: Report given to RN and Post -op Vital signs reviewed and stable  Post vital signs: Reviewed and stable  Last Vitals:  Vitals Value Taken Time  BP 140/47 10/16/19 1040  Temp 98   Pulse 86 10/16/19 1041  Resp 10 10/16/19 1041  SpO2 99 % 10/16/19 1041  Vitals shown include unvalidated device data.  Last Pain:  Vitals:   10/16/19 0954  TempSrc:   PainSc: 0-No pain         Complications: No complications documented.

## 2019-10-16 NOTE — Anesthesia Procedure Notes (Signed)
Date/Time: 10/16/2019 9:53 AM Performed by: Vista Deck, CRNA Pre-anesthesia Checklist: Patient identified, Emergency Drugs available, Suction available, Timeout performed and Patient being monitored Patient Re-evaluated:Patient Re-evaluated prior to induction Oxygen Delivery Method: Nasal Cannula

## 2019-10-16 NOTE — H&P (Signed)
@LOGO @   Primary Care Physician:  Celene Squibb, MD Primary Gastroenterologist:  Dr. Gala Romney  Pre-Procedure History & Physical: HPI:  Catherine Nichols is a 64 y.o. female here for further evaluation of anemia and screening colonoscopy.  Chronic constipation.  Does not have diarrhea.  Chronic anemia. Celiac screen negative. Past Medical History:  Diagnosis Date  . Aortic regurgitation    Status post aortic valve repair with a #61mm HAART 300 aortic valve ring annuloplasty December 2018 - [facility]  . Ascending aortic aneurysm [facility])    Status post ascending aortic and total arch replacement with a #24mm [place] multibranch arch graft December 2018 - [facility]  . Essential hypertension   . GERD (gastroesophageal reflux disease)   . IBS (irritable bowel syndrome)   . Infection due to trichomonas   . PAD (peripheral artery disease) ([place])   . Pseudoaneurysm of aortic arch [facility])    Status post repair August 2019 - [facility]  . 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 Uterine fibroid     Past Surgical History:  Procedure Laterality Date  . ABDOMINAL HYSTERECTOMY    . AORTIC VALVE REPAIR    . Bladder repair for prolapse  20 years ago  . COLONOSCOPY  01/12/2013   Dr. West Carbo; Normal colonoscopy s/p biopsy to rule out microscopic colitis.  Pathology benign.  . ESOPHAGOGASTRODUODENOSCOPY  12/2017   [facility]; Surgeon: Don Perking, MD; significant esophagitis treated with Fluconazole, PPI, and Carafate  . ESOPHAGOGASTRODUODENOSCOPY  01/12/2013   Dr. West Carbo; Normal exam s/p biopsy of duodenum to rule out celiac disease.  Pathology with increased intraepithelial lymphocytes.  States this could be an early indication of celiac disease but blunted villi are not seen.  Marland Kitchen LUNG LOBECTOMY Right 12/2017   for SCC  . THORACIC AORTIC ANEURYSM REPAIR  02/2016   x2  . TUBAL LIGATION      Prior to Admission medications   Medication Sig  Start Date End Date Taking? Authorizing Provider  acetaminophen (TYLENOL) 500 MG tablet Take 500 mg by mouth every 6 (six) hours as needed for mild pain or moderate pain.    Yes [provider]  ALPRAZolam Duanne Moron) 0.5 MG tablet Take 0.5-1 mg by mouth daily as needed for anxiety.  04/20/19  Yes [provider]  Apoaequorin (PREVAGEN) 10 MG CAPS Take by mouth.   Yes [provider]  esomeprazole (NEXIUM) 40 MG capsule Take 40 mg by mouth daily. 04/20/19  Yes [provider]  fexofenadine (ALLEGRA) 180 MG tablet Take 180 mg by mouth at bedtime as needed for allergies.  04/20/19  Yes [provider]  furosemide (LASIX) 20 MG tablet Take 20 mg by mouth daily as needed for fluid.  05/03/19  Yes [provider]  metoprolol tartrate (LOPRESSOR) 50 MG tablet Take 50 mg by mouth 2 (two) times daily.  04/18/19  Yes [provider]  Probiotic Product (PROBIOTIC DAILY PO) Take 1 capsule by mouth daily.   Yes [provider]  Magnesium Hydroxide (DULCOLAX SOFT CHEWS) 1200 MG CHEW Chew by mouth.    [provider]    Allergies as of 09/06/2019 - Review Complete 07/10/2019  Allergen Reaction Noted  . Aspirin Nausea Only 02/21/2014    Family History  Problem Relation Age of Onset  . Alzheimer's disease Mother   . Prostate cancer Father   . Colon cancer Cousin  Passsed at age 45    Social History   Socioeconomic History  . Marital status: Divorced    Spouse name: Not on file  . Number of children: Not on file  . Years of education: Not on file  . Highest education level: Not on file  Occupational History  . Not on file  Tobacco Use  . Smoking status: Former Smoker    Types: Cigarettes  . Smokeless tobacco: Never Used  . Tobacco comment: vape  Substance and Sexual Activity  . Alcohol use: No    Alcohol/week: 0.0 standard drinks  . Drug use: No  . Sexual activity: Not on file  Other Topics Concern  . Not on  file  Social History Narrative  . Not on file   Social Determinants of Health   Financial Resource Strain:   . Difficulty of Paying Living Expenses:   Food Insecurity:   . Worried About Charity fundraiser in the Last Year:   . Arboriculturist in the Last Year:   Transportation Needs:   . Film/video editor (Medical):   Marland Kitchen Lack of Transportation (Non-Medical):   Physical Activity:   . Days of Exercise per Week:   . Minutes of Exercise per Session:   Stress:   . Feeling of Stress :   Social Connections:   . Frequency of Communication with Friends and Family:   . Frequency of Social Gatherings with Friends and Family:   . Attends Religious Services:   . Active Member of Clubs or Organizations:   . Attends Archivist Meetings:   Marland Kitchen Marital Status:   Intimate Partner Violence:   . Fear of Current or Ex-Partner:   . Emotionally Abused:   Marland Kitchen Physically Abused:   . Sexually Abused:     Review of Systems: See HPI, otherwise negative ROS  Physical Exam: BP 135/74   Pulse 81   Temp 98.2 F (36.8 C) (Oral)   Resp 16   Ht 5\' 6"  (1.676 m)   Wt 61.2 kg   LMP 03/09/1998   SpO2 98%   BMI 21.78 kg/m  General:   Alert,  Well-developed, well-nourished, pleasant and cooperative in NAD Nose:  No deformity, discharge,  or lesions. Mouth:  No deformity or lesions. Neck:  Supple; no masses or thyromegaly. No significant cervical adenopathy. Lungs:  Clear throughout to auscultation.   No wheezes, crackles, or rhonchi. No acute distress. Heart:  Regular rate and rhythm; no murmurs, clicks, rubs,  or gallops. Abdomen: Non-distended, normal bowel sounds.  Soft and nontender without appreciable mass or hepatosplenomegaly.  Pulses:  Normal pulses noted. Extremities:  Without clubbing or edema.  Impression/Plan: 64 year old lady longstanding GERD, chronic anemia and constipation here for further evaluation via EGD and colonoscopy.  The risks, benefits, limitations,  imponderables and alternatives regarding both EGD and colonoscopy have been reviewed with the patient. Questions have been answered. All parties agreeable.      Notice: This dictation was prepared with Dragon dictation along with smaller phrase technology. Any transcriptional errors that result from this process are unintentional and may not be corrected upon review.

## 2019-10-16 NOTE — Op Note (Signed)
[facility] Patient Name: Catherine Nichols Procedure Date: 10/16/2019 10:04 AM MRN: [id number] Date of Birth: 11-21-1955 Attending MD: Norvel Richards , MD CSN: [id number] Age: 64 Admit Type: Outpatient Procedure:                Colonoscopy Indications:              Change in bowel habits Providers:                Norvel Richards, MD, Charlsie Quest. Theda Sers RN, RN,                            Crystal Page, Nelma Rothman, Technician Referring MD:              Medicines:                Propofol per Anesthesia Complications:            No immediate complications. Estimated Blood Loss:     Estimated blood loss was minimal. Procedure:                Pre-Anesthesia Assessment:                           - Prior to the procedure, a History and Physical                            was performed, and patient medications and                            allergies were reviewed. The patient's tolerance of                            previous anesthesia was also reviewed. The risks                            and benefits of the procedure and the sedation                            options and risks were discussed with the patient.                            All questions were answered, and informed consent                            was obtained. Prior Anticoagulants: The patient has                            taken no previous anticoagulant or antiplatelet                            agents. ASA Grade Assessment: II - A patient with                            mild systemic disease. After reviewing the risks  and benefits, the patient was deemed in                            satisfactory condition to undergo the procedure.                           After obtaining informed consent, the colonoscope                            was passed under direct vision. Throughout the                            procedure, the patient's blood pressure, pulse, and                             oxygen saturations were monitored continuously. The                            CF-HQ190L ([id number]) scope was introduced through                            the anus and advanced to the the cecum, identified                            by appendiceal orifice and ileocecal valve. The                            colonoscopy was performed without difficulty. The                            patient tolerated the procedure well. The quality                            of the bowel preparation was adequate. The                            ileocecal valve, appendiceal orifice, and rectum                            were photographed. The entire colon was well                            visualized. Scope In: 10:11:39 AM Scope Out: 10:33:20 AM Scope Withdrawal Time: 0 hours 14 minutes 27 seconds  Total Procedure Duration: 0 hours 21 minutes 41 seconds  Findings:      The perianal and digital rectal examinations were normal.      Scattered small-mouthed diverticula were found in the sigmoid colon and       descending colon.      Seven sessile polyps were found in the descending colon and ileocecal       valve. The polyps were 2 to 8 mm in size. These polyps were removed with       a cold snare. Resection and retrieval were complete. Estimated blood       loss was minimal.  The exam was otherwise without abnormality on direct and retroflexion       views. Impression:               - Diverticulosis in the sigmoid colon and in the                            descending colon.                           - Seven 2 to 8 mm polyps in the descending colon                            and at the ileocecal valve, removed with a cold                            snare. Resected and retrieved.                           - The examination was otherwise normal on direct                            and retroflexion views. Moderate Sedation:      Moderate (conscious) sedation was personally administered by an        anesthesia professional. The following parameters were monitored: oxygen       saturation, heart rate, blood pressure, respiratory rate, EKG, adequacy       of pulmonary ventilation, and response to care. Recommendation:           - Patient has a contact number available for                            emergencies. The signs and symptoms of potential                            delayed complications were discussed with the                            patient. Return to normal activities tomorrow.                            Written discharge instructions were provided to the                            patient.                           - Advance diet as tolerated.                           - Continue present medications.                           - Repeat colonoscopy date to be determined after                            pending  pathology results are reviewed for                            surveillance.                           - Return to GI office in 3 months. See EGD report. Procedure Code(s):        --- Professional ---                           [phone], Colonoscopy, flexible; with removal of                            tumor(s), polyp(s), or other lesion(s) by snare                            technique Diagnosis Code(s):        --- Professional ---                           K63.5, Polyp of colon                           R19.4, Change in bowel habit                           K57.30, Diverticulosis of large intestine without                            perforation or abscess without bleeding CPT copyright 2019 [employer]. All rights reserved. The codes documented in this report are preliminary and upon coder review may  be revised to meet current compliance requirements. Cristopher Estimable. Jenya Putz, MD Norvel Richards, MD 10/16/2019 10:37:34 AM This report has been signed electronically. Number of Addenda: 0

## 2019-10-16 NOTE — Discharge Instructions (Signed)
Colonoscopy Discharge Instructions  Read the instructions outlined below and refer to this sheet in the next few weeks. These discharge instructions provide you with general information on caring for yourself after you leave the hospital. Your doctor may also give you specific instructions. While your treatment has been planned according to the most current medical practices available, unavoidable complications occasionally occur. If you have any problems or questions after discharge, call Dr. Gala Romney at [phone]. ACTIVITY  You may resume your regular activity, but move at a slower pace for the next 24 hours.   Take frequent rest periods for the next 24 hours.   Walking will help get rid of the air and reduce the bloated feeling in your belly (abdomen).   No driving for 24 hours (because of the medicine (anesthesia) used during the test).    Do not sign any important legal documents or operate any machinery for 24 hours (because of the anesthesia used during the test).  NUTRITION  Drink plenty of fluids.   You may resume your normal diet as instructed by your doctor.   Begin with a light meal and progress to your normal diet. Heavy or fried foods are harder to digest and may make you feel sick to your stomach (nauseated).   Avoid alcoholic beverages for 24 hours or as instructed.  MEDICATIONS  You may resume your normal medications unless your doctor tells you otherwise.  WHAT YOU CAN EXPECT TODAY  Some feelings of bloating in the abdomen.   Passage of more gas than usual.   Spotting of blood in your stool or on the toilet paper.  IF YOU HAD POLYPS REMOVED DURING THE COLONOSCOPY:  No aspirin products for 7 days or as instructed.   No alcohol for 7 days or as instructed.   Eat a soft diet for the next 24 hours.  FINDING OUT THE RESULTS OF YOUR TEST Not all test results are available during your visit. If your test results are not back during the visit, make an appointment  with your caregiver to find out the results. Do not assume everything is normal if you have not heard from your caregiver or the medical facility. It is important for you to follow up on all of your test results.  SEEK IMMEDIATE MEDICAL ATTENTION IF:  You have more than a spotting of blood in your stool.   Your belly is swollen (abdominal distention).   You are nauseated or vomiting.   You have a temperature over 101.   You have abdominal pain or discomfort that is severe or gets worse throughout the day.    EGD Discharge instructions Please read the instructions outlined below and refer to this sheet in the next few weeks. These discharge instructions provide you with general information on caring for yourself after you leave the hospital. Your doctor may also give you specific instructions. While your treatment has been planned according to the most current medical practices available, unavoidable complications occasionally occur. If you have any problems or questions after discharge, please call your doctor. ACTIVITY  You may resume your regular activity but move at a slower pace for the next 24 hours.   Take frequent rest periods for the next 24 hours.   Walking will help expel (get rid of) the air and reduce the bloated feeling in your abdomen.   No driving for 24 hours (because of the anesthesia (medicine) used during the test).   You may shower.   Do not sign  any important legal documents or operate any machinery for 24 hours (because of the anesthesia used during the test).  NUTRITION  Drink plenty of fluids.   You may resume your normal diet.   Begin with a light meal and progress to your normal diet.   Avoid alcoholic beverages for 24 hours or as instructed by your caregiver.  MEDICATIONS  You may resume your normal medications unless your caregiver tells you otherwise.  WHAT YOU CAN EXPECT TODAY  You may experience abdominal discomfort such as a feeling of  fullness or "gas" pains.  FOLLOW-UP  Your doctor will discuss the results of your test with you.  SEEK IMMEDIATE MEDICAL ATTENTION IF ANY OF THE FOLLOWING OCCUR:  Excessive nausea (feeling sick to your stomach) and/or vomiting.   Severe abdominal pain and distention (swelling).   Trouble swallowing.   Temperature over 101 F (37.8 C).   Rectal bleeding or vomiting of blood.   Colon polyp, constipation and diverticulosis information provided  Take MiraLAX 17 g orally at bedtime as needed for constipation   Diverticulosis  Diverticulosis is a condition that develops when small pouches (diverticula) form in the wall of the large intestine (colon). The colon is where water is absorbed and stool (feces) is formed. The pouches form when the inside layer of the colon pushes through weak spots in the outer layers of the colon. You may have a few pouches or many of them. The pouches usually do not cause problems unless they become inflamed or infected. When this happens, the condition is called diverticulitis. What are the causes? The cause of this condition is not known. What increases the risk? The following factors may make you more likely to develop this condition: Being older than age 50. Your risk for this condition increases with age. Diverticulosis is rare among people younger than age 35. By age 71, many people have it. Eating a low-fiber diet. Having frequent constipation. Being overweight. Not getting enough exercise. Smoking. Taking over-the-counter pain medicines, like aspirin and ibuprofen. Having a family history of diverticulosis. What are the signs or symptoms? In most people, there are no symptoms of this condition. If you do have symptoms, they may include: Bloating. Cramps in the abdomen. Constipation or diarrhea. Pain in the lower left side of the abdomen. How is this diagnosed? Because diverticulosis usually has no symptoms, it is most often diagnosed during  an exam for other colon problems. The condition may be diagnosed by: Using a flexible scope to examine the colon (colonoscopy). Taking an X-ray of the colon after dye has been put into the colon (barium enema). Having a CT scan. How is this treated? You may not need treatment for this condition. Your health care provider may recommend treatment to prevent problems. You may need treatment if you have symptoms or if you previously had diverticulitis. Treatment may include: Eating a high-fiber diet. Taking a fiber supplement. Taking a live bacteria supplement (probiotic). Taking medicine to relax your colon. Follow these instructions at home: Medicines Take over-the-counter and prescription medicines only as told by your health care provider. If told by your health care provider, take a fiber supplement or probiotic. Constipation prevention Your condition may cause constipation. To prevent or treat constipation, you may need to: Drink enough fluid to keep your urine pale yellow. Take over-the-counter or prescription medicines. Eat foods that are high in fiber, such as beans, whole grains, and fresh fruits and vegetables. Limit foods that are high in fat and processed  sugars, such as fried or sweet foods.  General instructions Try not to strain when you have a bowel movement. Keep all follow-up visits as told by your health care provider. This is important. Contact a health care provider if you: Have pain in your abdomen. Have bloating. Have cramps. Have not had a bowel movement in 3 days. Get help right away if: Your pain gets worse. Your bloating becomes very bad. You have a fever or chills, and your symptoms suddenly get worse. You vomit. You have bowel movements that are bloody or black. You have bleeding from your rectum. Summary Diverticulosis is a condition that develops when small pouches (diverticula) form in the wall of the large intestine (colon). You may have a few  pouches or many of them. This condition is most often diagnosed during an exam for other colon problems. Treatment may include increasing the fiber in your diet, taking supplements, or taking medicines. This information is not intended to replace advice given to you by your health care provider. Make sure you discuss any questions you have with your health care provider. Document Revised: 09/22/2018 Document Reviewed: 09/22/2018 Elsevier Patient Education  [place].  Constipation, Adult Constipation is when a person:  Poops (has a bowel movement) fewer times in a week than normal.  Has a hard time pooping.  Has poop that is dry, hard, or bigger than normal. Follow these instructions at home: Eating and drinking   Eat foods that have a lot of fiber, such as: ? Fresh fruits and vegetables. ? Whole grains. ? Beans.  Eat less of foods that are high in fat, low in fiber, or overly processed, such as: ? [place] fries. ? Hamburgers. ? Cookies. ? Candy. ? Soda.  Drink enough fluid to keep your pee (urine) clear or pale yellow. General instructions  Exercise regularly or as told by your doctor.  Go to the restroom when you feel like you need to poop. Do not hold it in.  Take over-the-counter and prescription medicines only as told by your doctor. These include any fiber supplements.  Do pelvic floor retraining exercises, such as: ? Doing deep breathing while relaxing your lower belly (abdomen). ? Relaxing your pelvic floor while pooping.  Watch your condition for any changes.  Keep all follow-up visits as told by your doctor. This is important. Contact a doctor if:  You have pain that gets worse.  You have a fever.  You have not pooped for 4 days.  You throw up (vomit).  You are not hungry.  You lose weight.  You are bleeding from the anus.  You have thin, pencil-like poop (stool). Get help right away if:  You have a fever, and your symptoms suddenly  get worse.  You leak poop or have blood in your poop.  Your belly feels hard or bigger than normal (is bloated).  You have very bad belly pain.  You feel dizzy or you faint. This information is not intended to replace advice given to you by your health care provider. Make sure you discuss any questions you have with your health care provider. Document Revised: 02/05/2017 Document Reviewed: 08/14/2015 Elsevier Patient Education  [place].  Colon Polyps  Polyps are tissue growths inside the body. Polyps can grow in many places, including the large intestine (colon). A polyp may be a round bump or a mushroom-shaped growth. You could have one polyp or several. Most colon polyps are noncancerous (benign). However, some colon polyps can  become cancerous over time. Finding and removing the polyps early can help prevent this. What are the causes? The exact cause of colon polyps is not known. What increases the risk? You are more likely to develop this condition if you:  Have a family history of colon cancer or colon polyps.  Are older than 69 or older than 45 if you are African American.  Have inflammatory bowel disease, such as ulcerative colitis or Crohn's disease.  Have certain hereditary conditions, such as: ? Familial adenomatous polyposis. ? Lynch syndrome. ? Turcot syndrome. ? Peutz-Jeghers syndrome.  Are overweight.  Smoke cigarettes.  Do not get enough exercise.  Drink too much alcohol.  Eat a diet that is high in fat and red meat and low in fiber.  Had childhood cancer that was treated with abdominal radiation. What are the signs or symptoms? Most polyps do not cause symptoms. If you have symptoms, they may include:  Blood coming from your rectum when having a bowel movement.  Blood in your stool. The stool may look dark red or black.  Abdominal pain.  A change in bowel habits, such as constipation or diarrhea. How is this diagnosed? This condition  is diagnosed with a colonoscopy. This is a procedure in which a lighted, flexible scope is inserted into the anus and then passed into the colon to examine the area. Polyps are sometimes found when a colonoscopy is done as part of routine cancer screening tests. How is this treated? Treatment for this condition involves removing any polyps that are found. Most polyps can be removed during a colonoscopy. Those polyps will then be tested for cancer. Additional treatment may be needed depending on the results of testing. Follow these instructions at home: Lifestyle  Maintain a healthy weight, or lose weight if recommended by your health care provider.  Exercise every day or as told by your health care provider.  Do not use any products that contain nicotine or tobacco, such as cigarettes and e-cigarettes. If you need help quitting, ask your health care provider.  If you drink alcohol, limit how much you have: ? 0-1 drink a day for women. ? 0-2 drinks a day for men.  Be aware of how much alcohol is in your drink. In the [place] one drink equals one 12 oz bottle of beer (355 mL), one 5 oz glass of wine (148 mL), or one 1 oz shot of hard liquor (44 mL). Eating and drinking   Eat foods that are high in fiber, such as fruits, vegetables, and whole grains.  Eat foods that are high in calcium and vitamin D, such as milk, cheese, yogurt, eggs, liver, fish, and broccoli.  Limit foods that are high in fat, such as fried foods and desserts.  Limit the amount of red meat and processed meat you eat, such as hot dogs, sausage, bacon, and lunch meats. General instructions  Keep all follow-up visits as told by your health care provider. This is important. ? This includes having regularly scheduled colonoscopies. ? Talk to your health care provider about when you need a colonoscopy. Contact a health care provider if:  You have new or worsening bleeding during a bowel movement.  You have new or  increased blood in your stool.  You have a change in bowel habits.  You lose weight for no known reason. Summary  Polyps are tissue growths inside the body. Polyps can grow in many places, including the colon.  Most colon polyps are noncancerous (benign),  but some can become cancerous over time.  This condition is diagnosed with a colonoscopy.  Treatment for this condition involves removing any polyps that are found. Most polyps can be removed during a colonoscopy. This information is not intended to replace advice given to you by your health care provider. Make sure you discuss any questions you have with your health care provider. Document Revised: 06/10/2017 Document Reviewed: 06/10/2017 Elsevier Patient Education  2020 [facility] visit with [place] in 3 months  Further recommendations to follow pending review of pathology report  At patient request, I called Thamas Jaegers at [phone] and reviewed results

## 2019-10-17 ENCOUNTER — Other Ambulatory Visit: Payer: Self-pay

## 2019-10-17 LAB — SURGICAL PATHOLOGY

## 2019-10-18 ENCOUNTER — Other Ambulatory Visit: Payer: Medicare PPO

## 2019-10-18 ENCOUNTER — Encounter: Payer: Self-pay | Admitting: Internal Medicine

## 2019-10-19 ENCOUNTER — Encounter (HOSPITAL_COMMUNITY): Payer: Self-pay | Admitting: Internal Medicine

## 2019-10-19 ENCOUNTER — Other Ambulatory Visit: Payer: Medicare PPO

## 2019-10-19 DIAGNOSIS — Z712 Person consulting for explanation of examination or test findings: Secondary | ICD-10-CM | POA: Diagnosis not present

## 2019-10-19 DIAGNOSIS — R002 Palpitations: Secondary | ICD-10-CM | POA: Diagnosis not present

## 2019-10-19 DIAGNOSIS — Z0189 Encounter for other specified special examinations: Secondary | ICD-10-CM | POA: Diagnosis not present

## 2019-10-19 DIAGNOSIS — F411 Generalized anxiety disorder: Secondary | ICD-10-CM | POA: Diagnosis not present

## 2019-10-19 DIAGNOSIS — K219 Gastro-esophageal reflux disease without esophagitis: Secondary | ICD-10-CM | POA: Diagnosis not present

## 2019-10-19 DIAGNOSIS — J302 Other seasonal allergic rhinitis: Secondary | ICD-10-CM | POA: Diagnosis not present

## 2019-10-19 DIAGNOSIS — Y92538 Other ambulatory health services establishments as the place of occurrence of the external cause: Secondary | ICD-10-CM | POA: Diagnosis not present

## 2019-10-19 DIAGNOSIS — D509 Iron deficiency anemia, unspecified: Secondary | ICD-10-CM | POA: Diagnosis not present

## 2019-10-19 DIAGNOSIS — F331 Major depressive disorder, recurrent, moderate: Secondary | ICD-10-CM | POA: Diagnosis not present

## 2019-10-23 DIAGNOSIS — R002 Palpitations: Secondary | ICD-10-CM | POA: Diagnosis not present

## 2019-10-23 DIAGNOSIS — D509 Iron deficiency anemia, unspecified: Secondary | ICD-10-CM | POA: Diagnosis not present

## 2019-10-23 DIAGNOSIS — F411 Generalized anxiety disorder: Secondary | ICD-10-CM | POA: Diagnosis not present

## 2019-10-23 DIAGNOSIS — F331 Major depressive disorder, recurrent, moderate: Secondary | ICD-10-CM | POA: Diagnosis not present

## 2019-10-23 DIAGNOSIS — K219 Gastro-esophageal reflux disease without esophagitis: Secondary | ICD-10-CM | POA: Diagnosis not present

## 2019-10-23 DIAGNOSIS — J302 Other seasonal allergic rhinitis: Secondary | ICD-10-CM | POA: Diagnosis not present

## 2019-10-25 ENCOUNTER — Telehealth: Payer: Self-pay | Admitting: *Deleted

## 2019-10-25 ENCOUNTER — Ambulatory Visit (INDEPENDENT_AMBULATORY_CARE_PROVIDER_SITE_OTHER): Payer: Medicare PPO

## 2019-10-25 DIAGNOSIS — I359 Nonrheumatic aortic valve disorder, unspecified: Secondary | ICD-10-CM

## 2019-10-25 LAB — ECHOCARDIOGRAM COMPLETE
AR max vel: 1.88 cm2
AV Area VTI: 2.08 cm2
AV Area mean vel: 1.95 cm2
AV Mean grad: 10 mmHg
AV Peak grad: 20.1 mmHg
Ao pk vel: 2.24 m/s
Area-P 1/2: 3.23 cm2
Calc EF: 80.3 %
MV M vel: 5.28 m/s
MV Peak grad: 111.5 mmHg
P 1/2 time: 458 msec
S' Lateral: 2.74 cm
Single Plane A2C EF: 82.3 %
Single Plane A4C EF: 78.6 %

## 2019-10-25 NOTE — Telephone Encounter (Signed)
-----   Message from Satira Sark, MD sent at 10/24/2019 11:33 AM EDT ----- Results reviewed.  Please let her know that she did not have any significant arrhythmias.  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  She might be experiencing palpitations related to this intermittently, but would not necessarily change medical therapy at this time.  Importantly, we did not see any atrial fibrillation.  Please arrange a 105-month office visit.

## 2019-10-25 NOTE — Telephone Encounter (Signed)
Patient informed. Copy sent to PCP °

## 2019-10-26 ENCOUNTER — Telehealth: Payer: Self-pay | Admitting: *Deleted

## 2019-10-26 NOTE — Telephone Encounter (Signed)
LM to return call.

## 2019-10-26 NOTE — Telephone Encounter (Signed)
-----   Message from Satira Sark, MD sent at 10/26/2019  8:10 AM EDT ----- Results reviewed.  LV and RV function are normal.  Pulmonary artery systolic pressure only mildly increased at this point, was apparently severely increased by previous study at [facility].  Aortic valve shows moderate aortic regurgitation and mildly stenotic flow.  For now would continue with clinical observation as arranged.

## 2019-10-26 NOTE — Telephone Encounter (Signed)
Pt voiced understanding - routed to pcp  

## 2019-11-03 DIAGNOSIS — R002 Palpitations: Secondary | ICD-10-CM | POA: Diagnosis not present

## 2019-11-03 DIAGNOSIS — R42 Dizziness and giddiness: Secondary | ICD-10-CM | POA: Diagnosis not present

## 2019-11-03 DIAGNOSIS — K5900 Constipation, unspecified: Secondary | ICD-10-CM | POA: Diagnosis not present

## 2019-11-03 DIAGNOSIS — Z6822 Body mass index (BMI) 22.0-22.9, adult: Secondary | ICD-10-CM | POA: Diagnosis not present

## 2019-11-03 DIAGNOSIS — I10 Essential (primary) hypertension: Secondary | ICD-10-CM | POA: Diagnosis not present

## 2019-11-03 DIAGNOSIS — J309 Allergic rhinitis, unspecified: Secondary | ICD-10-CM | POA: Diagnosis not present

## 2019-11-03 DIAGNOSIS — F419 Anxiety disorder, unspecified: Secondary | ICD-10-CM | POA: Diagnosis not present

## 2019-11-03 DIAGNOSIS — R0609 Other forms of dyspnea: Secondary | ICD-10-CM | POA: Diagnosis not present

## 2019-11-03 DIAGNOSIS — K219 Gastro-esophageal reflux disease without esophagitis: Secondary | ICD-10-CM | POA: Diagnosis not present

## 2019-11-23 ENCOUNTER — Other Ambulatory Visit: Payer: Medicare PPO

## 2019-12-29 DIAGNOSIS — H00024 Hordeolum internum left upper eyelid: Secondary | ICD-10-CM | POA: Diagnosis not present

## 2020-01-17 DIAGNOSIS — J302 Other seasonal allergic rhinitis: Secondary | ICD-10-CM | POA: Diagnosis not present

## 2020-01-17 DIAGNOSIS — Z136 Encounter for screening for cardiovascular disorders: Secondary | ICD-10-CM | POA: Diagnosis not present

## 2020-01-17 DIAGNOSIS — D509 Iron deficiency anemia, unspecified: Secondary | ICD-10-CM | POA: Diagnosis not present

## 2020-01-17 DIAGNOSIS — R011 Cardiac murmur, unspecified: Secondary | ICD-10-CM | POA: Diagnosis not present

## 2020-01-17 DIAGNOSIS — K581 Irritable bowel syndrome with constipation: Secondary | ICD-10-CM | POA: Diagnosis not present

## 2020-01-17 DIAGNOSIS — F411 Generalized anxiety disorder: Secondary | ICD-10-CM | POA: Diagnosis not present

## 2020-01-17 DIAGNOSIS — R002 Palpitations: Secondary | ICD-10-CM | POA: Diagnosis not present

## 2020-01-17 DIAGNOSIS — D649 Anemia, unspecified: Secondary | ICD-10-CM | POA: Diagnosis not present

## 2020-01-17 DIAGNOSIS — Z0189 Encounter for other specified special examinations: Secondary | ICD-10-CM | POA: Diagnosis not present

## 2020-01-17 DIAGNOSIS — F331 Major depressive disorder, recurrent, moderate: Secondary | ICD-10-CM | POA: Diagnosis not present

## 2020-01-17 DIAGNOSIS — R0602 Shortness of breath: Secondary | ICD-10-CM | POA: Diagnosis not present

## 2020-01-17 DIAGNOSIS — R519 Headache, unspecified: Secondary | ICD-10-CM | POA: Diagnosis not present

## 2020-01-17 DIAGNOSIS — Z712 Person consulting for explanation of examination or test findings: Secondary | ICD-10-CM | POA: Diagnosis not present

## 2020-01-17 DIAGNOSIS — Y92538 Other ambulatory health services establishments as the place of occurrence of the external cause: Secondary | ICD-10-CM | POA: Diagnosis not present

## 2020-01-22 NOTE — Progress Notes (Deleted)
Referring Provider: Celene Squibb, MD Primary Care Physician:  Celene Squibb, MD Primary GI Physician: Dr. Gala Romney  No chief complaint on file.   HPI:   Catherine Nichols is a 64 y.o. female presenting today for follow-up on constipation and anemia s/p colonoscopy and EGD. She has history of IBS with diarrhea but more recently with alternating constipation and diarrhea, GERD, esophagitis, and chronic anemia dating back to 2011.  Hemoglobin has been in the 10-11 range until 2019 when hemoglobin was in the 8-9 range.  She was found to have hemoglobin of 6.8 in January 2021 and required blood transfusion at [facility].  Stool was occult blood negative at that time.   She was last seen in our office at the time of initial consult on 07/10/2019.  Reported history of IBS with bowel fluctuating from diarrhea to constipation.  She was eating raisins and taking Dulcolax daily to every couple of days.  Reported 3 days of dark loose stools the week prior to office visit, but she was also taking oral iron which was new for her.  No BRBPR.  Had a lot of gas, bowel urgency, and stools with a terrible odor.  GERD symptoms well controlled on Nexium daily.  Reported she has been losing weight although documented weights have been fairly stable over the last year with 3 pound weight loss.  Plan included requesting recent labs from PCP, stopping Dulcolax and starting MiraLAX daily, abdominal x-ray to evaluate stool burden, avoid items that cause gas and bloating, follow lactose-free diet, low-fat diet, colonoscopy and upper endoscopy, follow-up after procedures.  Received labs completed with PCP 06/26/2019.  Hemoglobin 9.0 with microcytic indices.  Iron 20 (L), iron saturation 5% (L).  Recommended obtaining ferritin, vitamin B12, folate, IgA total, and TTG IgA.  Labs completed 07/12/2019: Ferritin 13, B12 350, folate within normal limits, negative celiac screen. Abdominal x-ray 07/12/2019: Nonobstructive gas pattern with moderate  stool in the colon.  Recommended continue with MiraLAX daily.  Procedures 10/16/2019: EGD: Normal esophagus, erythematous mucosa in the stomach s/p biopsy, normal examined duodenum.  Gastric biopsy with antral oxyntic mucosa with slight chronic inflammation, negative for H. pylori. Colonoscopy: 7 sessile polyps in the descending colon and ileocecal valve, diverticulosis in the sigmoid and descending colon.  Ileocecal polypectomy pathology with colonic mucosa with benign lymphoid aggregate, descending colon polypectomy pathology with fragments of sessile serrated polyp/adenoma without dysplasia.  Recommended repeat colonoscopy in 3 years.  Today:   Constipation:   IDA: Hemoglobin 12.5 in August 2021, [facility] low at 25.8.     May need Givens.   Past Medical History:  Diagnosis Date  . Aortic regurgitation    Status post aortic valve repair with a #49mm HAART 300 aortic valve ring annuloplasty December 2018 - [facility]  . Ascending aortic aneurysm [facility])    Status post ascending aortic and total arch replacement with a #65mm Vasc[place]k [place] multibranch arch graft December 2018 - [facility]  . Essential hypertension   . GERD (gastroesophageal reflux disease)   . IBS (irritable bowel syndrome)   . Infection due to trichomonas   . PAD (peripheral artery disease) ([place])   . 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 Uterine fibroid     Past Surgical History:  Procedure Laterality Date  . ABDOMINAL HYSTERECTOMY    .  AORTIC VALVE REPAIR    . BIOPSY  10/16/2019   Procedure: BIOPSY;  Surgeon: Daneil Dolin, MD;  [facility];  Service: Endoscopy;;  . Bladder repair for prolapse  20 years ago  . COLONOSCOPY  01/12/2013   Dr. West Carbo; Normal colonoscopy s/p biopsy to rule out microscopic colitis.  Pathology benign.  . COLONOSCOPY WITH PROPOFOL N/A  10/16/2019   Procedure: COLONOSCOPY WITH PROPOFOL;  Surgeon: Daneil Dolin, MD;  [facility];  Service: Endoscopy;  Laterality: N/A;  9:00am  . ESOPHAGOGASTRODUODENOSCOPY  12/2017   [facility]; Surgeon: Don Perking, MD; significant esophagitis treated with Fluconazole, PPI, and Carafate  . ESOPHAGOGASTRODUODENOSCOPY  01/12/2013   Dr. West Carbo; Normal exam s/p biopsy of duodenum to rule out celiac disease.  Pathology with increased intraepithelial lymphocytes.  States this could be an early indication of celiac disease but blunted villi are not seen.  . ESOPHAGOGASTRODUODENOSCOPY (EGD) WITH PROPOFOL N/A 10/16/2019   Procedure: ESOPHAGOGASTRODUODENOSCOPY (EGD) WITH PROPOFOL;  Surgeon: Daneil Dolin, MD;  [facility];  Service: Endoscopy;  Laterality: N/A;  . LUNG LOBECTOMY Right 12/2017   for SCC  . POLYPECTOMY  10/16/2019   Procedure: POLYPECTOMY;  Surgeon: Daneil Dolin, MD;  [facility];  Service: Endoscopy;;  . THORACIC AORTIC ANEURYSM REPAIR  02/2016   x2  . TUBAL LIGATION      Current Outpatient Medications  Medication Sig Dispense Refill  . acetaminophen (TYLENOL) 500 MG tablet Take 500 mg by mouth every 6 (six) hours as needed for mild pain or moderate pain.     Marland Kitchen ALPRAZolam (XANAX) 0.5 MG tablet Take 0.5-1 mg by mouth daily as needed for anxiety.     Marland Kitchen Apoaequorin (PREVAGEN) 10 MG CAPS Take by mouth.    . esomeprazole (NEXIUM) 40 MG capsule Take 40 mg by mouth daily.    . fexofenadine (ALLEGRA) 180 MG tablet Take 180 mg by mouth at bedtime as needed for allergies.     . furosemide (LASIX) 20 MG tablet Take 20 mg by mouth daily as needed for fluid.     . Magnesium Hydroxide (DULCOLAX SOFT CHEWS) 1200 MG CHEW Chew by mouth.    . metoprolol tartrate (LOPRESSOR) 50 MG tablet Take 50 mg by mouth 2 (two) times daily.     . Probiotic Product (PROBIOTIC DAILY PO) Take 1 capsule by mouth daily.     No current facility-administered medications for this  visit.    Allergies as of 01/24/2020 - Review Complete 10/16/2019  Allergen Reaction Noted  . Aspirin Nausea Only 02/21/2014    Family History  Problem Relation Age of Onset  . Alzheimer's disease Mother   . Prostate cancer Father   . Colon cancer Cousin        Passsed at age 33    Social History   Socioeconomic History  . Marital status: Divorced    Spouse name: Not on file  . Number of children: Not on file  . Years of education: Not on file  . Highest education level: Not on file  Occupational History  . Not on file  Tobacco Use  . Smoking status: Former Smoker    Types: Cigarettes  . Smokeless tobacco: Never Used  . Tobacco comment: vape  Substance and Sexual Activity  . Alcohol use: No    Alcohol/week: 0.0 standard drinks  . Drug use: No  . Sexual activity: Not on file  Other Topics Concern  . Not on file  Social History Narrative  . Not on file   Social Determinants of Health   Financial Resource Strain:   . Difficulty of Paying Living Expenses: Not on file  Food Insecurity:   . Worried About Charity fundraiser in the Last Year: Not on file  . Ran Out of Food in the Last Year: Not on file  Transportation Needs:   . Lack of Transportation (Medical): Not on file  . Lack of Transportation (Non-Medical): Not on file  Physical Activity:   . Days of Exercise per Week: Not on file  . Minutes of Exercise per Session: Not on file  Stress:   . Feeling of Stress : Not on file  Social Connections:   . Frequency of Communication with Friends and Family: Not on file  . Frequency of Social Gatherings with Friends and Family: Not on file  . Attends Religious Services: Not on file  . Active Member of Clubs or Organizations: Not on file  . Attends Archivist Meetings: Not on file  . Marital Status: Not on file    Review of Systems: Gen: Denies fever, chills, anorexia. Denies fatigue, weakness, weight loss.  CV: Denies chest pain, palpitations,  syncope, peripheral edema, and claudication. Resp: Denies dyspnea at rest, cough, wheezing, coughing up blood, and pleurisy. GI: Denies vomiting blood, jaundice, and fecal incontinence.   Denies dysphagia or odynophagia. Derm: Denies rash, itching, dry skin Psych: Denies depression, anxiety, memory loss, confusion. No homicidal or suicidal ideation.  Heme: Denies bruising, bleeding, and enlarged lymph nodes.  Physical Exam: LMP 03/09/1998  General:   Alert and oriented. No distress noted. Pleasant and cooperative.  Head:  Normocephalic and atraumatic. Eyes:  Conjuctiva clear without scleral icterus. Mouth:  Oral mucosa pink and moist. Good dentition. No lesions. Heart:  S1, S2 present without murmurs appreciated. Lungs:  Clear to auscultation bilaterally. No wheezes, rales, or rhonchi. No distress.  Abdomen:  +BS, soft, non-tender and non-distended. No rebound or guarding. No HSM or masses noted. Msk:  Symmetrical without gross deformities. Normal posture. Extremities:  Without edema. Neurologic:  Alert and  oriented x4 Psych:  Alert and cooperative. Normal mood and affect.

## 2020-01-24 ENCOUNTER — Ambulatory Visit: Payer: Medicare PPO | Admitting: Gastroenterology

## 2020-01-24 ENCOUNTER — Encounter: Payer: Self-pay | Admitting: Internal Medicine

## 2020-01-30 ENCOUNTER — Ambulatory Visit (INDEPENDENT_AMBULATORY_CARE_PROVIDER_SITE_OTHER): Payer: Medicare PPO | Admitting: Cardiology

## 2020-01-30 ENCOUNTER — Other Ambulatory Visit: Payer: Self-pay

## 2020-01-30 ENCOUNTER — Encounter: Payer: Self-pay | Admitting: Cardiology

## 2020-01-30 VITALS — BP 110/62 | HR 72 | Ht 66.0 in | Wt 135.0 lb

## 2020-01-30 DIAGNOSIS — I359 Nonrheumatic aortic valve disorder, unspecified: Secondary | ICD-10-CM

## 2020-01-30 DIAGNOSIS — R002 Palpitations: Secondary | ICD-10-CM | POA: Diagnosis not present

## 2020-01-30 MED ORDER — METOPROLOL TARTRATE 50 MG PO TABS: 75.0000 mg | ORAL_TABLET | Freq: Two times a day (BID) | ORAL | 3 refills | Status: DC

## 2020-01-30 NOTE — Patient Instructions (Signed)
Medication Instructions:   Your physician has recommended you make the following change in your medication:   Increase metoprolol tartrate to 75 mg by mouth twice daily (1 &1/2 tablets)  Continue other medications the same  Labwork:  None  Testing/Procedures:  None  Follow-Up:  Your physician recommends that you schedule a follow-up appointment in: 6 months.   Any Other Special Instructions Will Be Listed Below (If Applicable).  If you need a refill on your cardiac medications before your next appointment, please call your pharmacy.

## 2020-01-30 NOTE — Progress Notes (Signed)
Cardiology Office Note  Date: 01/30/2020   ID: Catherine Nichols, DOB December 10, 1955, MRN [id number]  PCP:  Celene Squibb, MD  Cardiologist:  Rozann Lesches, MD Electrophysiologist:  None   Chief Complaint  Patient presents with  . Cardiac follow-up    History of Present Illness: Catherine Nichols is a 65 y.o. female last seen in August 2021. She presents to the office for follow-up of palpitations. Cardiac history is extensively reviewed in the previous note.  She wore a cardiac monitor back in August, longstanding history of palpitations.  She did have ectopic atrial and ventricular beats as well as a brief burst of SVT, but no prolonged palpitations, no atrial fibrillation.  She is on Lopressor 50 mg twice daily.  I personally reviewed her ECG today which shows normal sinus rhythm, left atrial enlargement, anteroseptal Q waves.  Past Medical History:  Diagnosis Date  . Aortic regurgitation    Status post aortic valve repair with a #53mm HAART 300 aortic valve ring annuloplasty December 2018 - [facility]  . Ascending aortic aneurysm [facility])    Status post ascending aortic and total arch replacement with a #39mm [place] multibranch arch graft December 2018 - [facility]  . Essential hypertension   . GERD (gastroesophageal reflux disease)   . IBS (irritable bowel syndrome)   . Infection due to trichomonas   . PAD (peripheral artery disease) ([place])   . 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 Uterine fibroid     Past Surgical History:  Procedure Laterality Date  . ABDOMINAL HYSTERECTOMY    . AORTIC VALVE REPAIR    . BIOPSY  10/16/2019   Procedure: BIOPSY;  Surgeon: Daneil Dolin, MD;  [facility];  Service: Endoscopy;;  . Bladder repair for prolapse  20 years ago  . COLONOSCOPY  01/12/2013   Dr. West Carbo; Normal  colonoscopy s/p biopsy to rule out microscopic colitis.  Pathology benign.  . COLONOSCOPY WITH PROPOFOL N/A 10/16/2019   Procedure: COLONOSCOPY WITH PROPOFOL;  Surgeon: Daneil Dolin, MD;  [facility];  Service: Endoscopy;  Laterality: N/A;  9:00am  . ESOPHAGOGASTRODUODENOSCOPY  12/2017   [facility]; Surgeon: Don Perking, MD; significant esophagitis treated with Fluconazole, PPI, and Carafate  . ESOPHAGOGASTRODUODENOSCOPY  01/12/2013   Dr. West Carbo; Normal exam s/p biopsy of duodenum to rule out celiac disease.  Pathology with increased intraepithelial lymphocytes.  States this could be an early indication of celiac disease but blunted villi are not seen.  . ESOPHAGOGASTRODUODENOSCOPY (EGD) WITH PROPOFOL N/A 10/16/2019   Procedure: ESOPHAGOGASTRODUODENOSCOPY (EGD) WITH PROPOFOL;  Surgeon: Daneil Dolin, MD;  [facility];  Service: Endoscopy;  Laterality: N/A;  . LUNG LOBECTOMY Right 12/2017   for SCC  . POLYPECTOMY  10/16/2019   Procedure: POLYPECTOMY;  Surgeon: Daneil Dolin, MD;  [facility];  Service: Endoscopy;;  . THORACIC AORTIC ANEURYSM REPAIR  02/2016   x2  . TUBAL LIGATION      Current Outpatient Medications  Medication Sig Dispense Refill  . acetaminophen (TYLENOL) 500 MG tablet Take 500 mg by mouth every 6 (six) hours as needed for mild pain or moderate pain.     Marland Kitchen ALPRAZolam (XANAX) 0.5 MG tablet Take 0.5-1 mg by mouth daily as needed for anxiety.     Marland Kitchen  Apoaequorin (PREVAGEN) 10 MG CAPS Take by mouth.    . esomeprazole (NEXIUM) 40 MG capsule Take 40 mg by mouth daily.    . fexofenadine (ALLEGRA) 180 MG tablet Take 180 mg by mouth at bedtime as needed for allergies.     . furosemide (LASIX) 20 MG tablet Take 20 mg by mouth daily as needed for fluid.     . Magnesium Hydroxide (DULCOLAX SOFT CHEWS) 1200 MG CHEW Chew by mouth.    . metoprolol tartrate (LOPRESSOR) 50 MG tablet Take 1.5 tablets (75 mg total) by mouth 2 (two) times daily. 270 tablet 3   . Probiotic Product (PROBIOTIC DAILY PO) Take 1 capsule by mouth daily.     No current facility-administered medications for this visit.   Allergies:  Aspirin   ROS: No syncope.  Physical Exam: VS:  BP 110/62   Pulse 72   Ht 5\' 6"  (1.676 m)   Wt 135 lb (61.2 kg)   LMP 03/09/1998   SpO2 97%   BMI 21.79 kg/m , BMI Body mass index is 21.79 kg/m.  Wt Readings from Last 3 Encounters:  01/30/20 135 lb (61.2 kg)  10/16/19 134 lb 14.7 oz (61.2 kg)  10/13/19 135 lb (61.2 kg)    General: Patient appears comfortable at rest. HEENT: Conjunctiva and lids normal, wearing a mask. Neck: Supple, no elevated JVP or carotid bruits, no thyromegaly. Lungs: Clear to auscultation, nonlabored breathing at rest. Cardiac: Regular rate and rhythm, 3/6 systolic and 2/6 to 3/6 diastolic murmurs, no rub. Extremities: No pitting edema.  ECG:  An ECG dated 08/31/2019 was personally reviewed today and demonstrated:  Normal sinus rhythm.  Recent Labwork: 10/13/2019: BUN 14; Creatinine, Ser 0.59; Hemoglobin 12.5; Platelets 146; Potassium 4.2; Sodium 138   Other Studies Reviewed Today:  Echocardiogram 10/25/2019: 1. Left ventricular ejection fraction, by estimation, is 65 to 70%. The  left ventricle has normal function. The left ventricle has no regional  wall motion abnormalities. Left ventricular diastolic parameters are  consistent with Grade I diastolic  dysfunction (impaired relaxation).  2. Right ventricular systolic function is normal. The right ventricular  size is normal. There is mildly elevated pulmonary artery systolic  pressure.  3. The mitral valve is normal in structure. Trivial mitral valve  regurgitation. No evidence of mitral stenosis.  4. Per medical records aortic valve repair with a #43mm HAART 300 aortic  valve ring annuloplasty. The aortic valve has been repaired/replaced.  Aortic valve regurgitation is moderate. Mild aortic valve stenosis.  5. The inferior vena cava is  normal in size with greater than 50%  respiratory variability, suggesting right atrial pressure of 3 mmHg.   Cardiac monitor August 2021: ZIO AT reviewed, 2 days, 22 hours analyzed.  Predominant rhythm is sinus with heart rate ranging from 48 bpm up to 142 bpm and average heart rate 72 bpm.  Occasional PACs were noted representing 1.1% total beats.  Rare PVCs were noted representing less than 1% total beats.  One burst of SVT noted lasting 7 beats, otherwise no sustained arrhythmias or pauses.  Assessment and Plan:  1. Palpitations, longstanding and not associated with syncope. Cardiac monitor demonstrated atrial and ventricular ectopy with brief burst of SVT, otherwise no sustained arrhythmias, no atrial fibrillation. We will increase Lopressor 75 mg twice daily and continue with observation.  2. 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 Follow-up echocardiogram shows normal LVEF with moderate aortic regurgitation and mean AV gradient of 10 mmHg. Continue with observation for now.  Medication Adjustments/Labs and Tests Ordered: Current medicines are reviewed at length with the patient today.  Concerns regarding medicines are outlined above.   Tests Ordered: Orders Placed This Encounter  Procedures  . EKG 12-Lead    Medication Changes: Meds ordered this encounter  Medications  . metoprolol tartrate (LOPRESSOR) 50 MG tablet    Sig: Take 1.5 tablets (75 mg total) by mouth 2 (two) times daily.    Dispense:  270 tablet    Refill:  3    01/30/2020 dose increase    Disposition:  Follow up 6 months in the [place] office.  Signed, Satira Sark, MD, [facility] 01/30/2020 3:34 PM    [facility] [place], [place], [place] [address] Phone: [phone]; Fax: [phone]

## 2020-03-19 ENCOUNTER — Telehealth: Payer: Self-pay | Admitting: *Deleted

## 2020-03-19 ENCOUNTER — Other Ambulatory Visit: Payer: Self-pay

## 2020-03-19 ENCOUNTER — Encounter: Payer: Self-pay | Admitting: *Deleted

## 2020-03-19 ENCOUNTER — Encounter: Payer: Self-pay | Admitting: Gastroenterology

## 2020-03-19 ENCOUNTER — Other Ambulatory Visit (HOSPITAL_COMMUNITY)
Admission: RE | Admit: 2020-03-19 | Discharge: 2020-03-19 | Disposition: A | Discharge status: Home / Self Care | Payer: Medicare HMO | Source: Ambulatory Visit | Attending: Gastroenterology | Admitting: Gastroenterology

## 2020-03-19 ENCOUNTER — Ambulatory Visit (INDEPENDENT_AMBULATORY_CARE_PROVIDER_SITE_OTHER): Payer: Medicare HMO | Admitting: Gastroenterology

## 2020-03-19 VITALS — BP 166/64 | HR 72 | Temp 98.2°F | Ht 66.0 in | Wt 134.0 lb

## 2020-03-19 DIAGNOSIS — R1010 Upper abdominal pain, unspecified: Secondary | ICD-10-CM

## 2020-03-19 DIAGNOSIS — K5900 Constipation, unspecified: Secondary | ICD-10-CM | POA: Diagnosis not present

## 2020-03-19 DIAGNOSIS — D509 Iron deficiency anemia, unspecified: Secondary | ICD-10-CM

## 2020-03-19 DIAGNOSIS — R109 Unspecified abdominal pain: Secondary | ICD-10-CM | POA: Insufficient documentation

## 2020-03-19 LAB — CBC WITH DIFFERENTIAL/PLATELET
Abs Immature Granulocytes: 0.03 10*3/uL (ref 0.00–0.07)
Basophils Absolute: 0 10*3/uL (ref 0.0–0.1)
Basophils Relative: 0 %
Eosinophils Absolute: 0 10*3/uL (ref 0.0–0.5)
Eosinophils Relative: 0 %
HCT: 41.3 % (ref 36.0–46.0)
Hemoglobin: 13 g/dL (ref 12.0–15.0)
Immature Granulocytes: 0 %
Lymphocytes Relative: 18 %
Lymphs Abs: 1.3 10*3/uL (ref 0.7–4.0)
MCH: 28.4 pg (ref 26.0–34.0)
MCHC: 31.5 g/dL (ref 30.0–36.0)
MCV: 90.4 fL (ref 80.0–100.0)
Monocytes Absolute: 0.3 10*3/uL (ref 0.1–1.0)
Monocytes Relative: 5 %
Neutro Abs: 5.4 10*3/uL (ref 1.7–7.7)
Neutrophils Relative %: 77 %
Platelets: 178 10*3/uL (ref 150–400)
RBC: 4.57 MIL/uL (ref 3.87–5.11)
RDW: 12.8 % (ref 11.5–15.5)
WBC: 7.1 10*3/uL (ref 4.0–10.5)
nRBC: 0 % (ref 0.0–0.2)

## 2020-03-19 LAB — COMPREHENSIVE METABOLIC PANEL
ALT: 15 U/L (ref 0–44)
AST: 25 U/L (ref 15–41)
Albumin: 4.2 g/dL (ref 3.5–5.0)
Alkaline Phosphatase: 75 U/L (ref 38–126)
Anion gap: 7 (ref 5–15)
BUN: 12 mg/dL (ref 8–23)
CO2: 27 mmol/L (ref 22–32)
Calcium: 9.4 mg/dL (ref 8.9–10.3)
Chloride: 102 mmol/L (ref 98–111)
Creatinine, Ser: 0.66 mg/dL (ref 0.44–1.00)
GFR, Estimated: >60 mL/min (ref >60)
Glucose, Bld: 82 mg/dL (ref 70–99)
Potassium: 4.6 mmol/L (ref 3.5–5.1)
Sodium: 136 mmol/L (ref 135–145)
Total Bilirubin: 0.5 mg/dL (ref 0.3–1.2)
Total Protein: 7.6 g/dL (ref 6.5–8.1)

## 2020-03-19 LAB — LIPASE, BLOOD: Lipase: 23 U/L (ref 11–51)

## 2020-03-19 LAB — FERRITIN: Ferritin: 12 ng/mL (ref 11–307)

## 2020-03-19 MED ORDER — LINACLOTIDE 72 MCG PO CAPS: 72.0000 ug | ORAL_CAPSULE | Freq: Every day | ORAL | 0 refills | Status: DC

## 2020-03-19 NOTE — Progress Notes (Signed)
Primary Care Physician: Celene Squibb, MD  Primary Gastroenterologist:  Garfield Cornea, MD   Chief Complaint  Patient presents with  . Constipation    Occ. Has foul odor to bm and gas. Has cramping under rib area if gets constipated. C/o "bubbling"    HPI: Catherine Nichols is a 65 y.o. female here for follow-up.  She has a history of IBS with alternating diarrhea and constipation.  Also with history of acute on chronic anemia 2021.  Hemoglobin was 6.8 in January 2021.  Heme-negative at that time.  No NSAIDs.  Labs in May 2021: Folate and B12 normal.  Celiac screen negative.  Ferritin low normal at 13.  Last available hemoglobin was from Aug 2021 with hemoglobin of 12.5, MCV normal.  Completed EGD/colonoscopy in August 2021.  Stomach appeared erythematous, biopsies were negative. Seven 2 to 8 mm polyps removed (multiple tubular adenomas).  Colonic diverticulosis noted.  Plan for surveillance colonoscopy in 3 years.  Patient presents back today with ongoing bowel issues.  Continues to struggle to have regular BMs. Has to take miralax and dulcolax soft chews every day. If doesn't go every day, will have upper abdominal cramps especially if bends over.  Upper abdominal pain frequent.  Also with pain in the lower abdomen radiating into the back.  Stomach "bubbles" a lot. If gets cleaned out, then pain lessens goes. No melena, brbpr. Probiotics several months but sometimes forgets.  Complains of malodorous stools and gas.  Noted it before starting MiraLAX.  No heartburn.  No nausea or vomiting.  Continues to crave ice.  Weight has been stable over the past 6 months.  Does not tolerate oral iron due to worsening constipation.  Recently started making smoothies with powdered beets.      Current Outpatient Medications  Medication Sig Dispense Refill  . acetaminophen (TYLENOL) 500 MG tablet Take 500 mg by mouth every 6 (six) hours as needed for mild pain or moderate pain.     Marland Kitchen ALPRAZolam  (XANAX) 0.5 MG tablet Take 0.5-1 mg by mouth daily as needed for anxiety.     Marland Kitchen Apoaequorin (PREVAGEN) 10 MG CAPS Take by mouth daily.    Marland Kitchen esomeprazole (NEXIUM) 40 MG capsule Take 40 mg by mouth daily.    . fexofenadine (ALLEGRA) 180 MG tablet Take 180 mg by mouth at bedtime as needed for allergies.     . furosemide (LASIX) 20 MG tablet Take 20 mg by mouth daily as needed for fluid.     . Magnesium Hydroxide (DULCOLAX SOFT CHEWS) 1200 MG CHEW Chew by mouth as needed.    . metoprolol tartrate (LOPRESSOR) 50 MG tablet Take 1.5 tablets (75 mg total) by mouth 2 (two) times daily. 270 tablet 3  . polyethylene glycol (MIRALAX / GLYCOLAX) 17 g packet Take 17 g by mouth daily as needed.    . Probiotic Product (PROBIOTIC DAILY PO) Take 1 capsule by mouth daily.     No current facility-administered medications for this visit.    Allergies as of 03/19/2020 - Review Complete 03/19/2020  Allergen Reaction Noted  . Aspirin Nausea Only 02/21/2014    ROS:  General: Negative for anorexia, weight loss, fever, chills, positive fatigue, weakness. ENT: Negative for hoarseness, difficulty swallowing , nasal congestion. CV: Negative for chest pain, angina,  dyspnea on exertion, peripheral edema.  History of intermittent palpitations Respiratory: Negative for dyspnea at rest, dyspnea on exertion, cough, sputum, wheezing.  GI: See history of present illness.  GU:  Negative for dysuria, hematuria, urinary incontinence, urinary frequency, nocturnal urination.  Endo: Negative for unusual weight change.    Physical Examination:   BP (!) 166/64   Pulse 72   Temp 98.2 F (36.8 C) (Temporal)   Ht 5\' 6"  (1.676 m)   Wt 134 lb (60.8 kg)   LMP 03/09/1998   BMI 21.63 kg/m   General: Well-nourished, well-developed in no acute distress.  Eyes: No icterus. Mouth: masked Lungs: Clear to auscultation bilaterally.  Heart: Regular rate and rhythm, no murmurs rubs or gallops.  Abdomen: Bowel sounds are normal,  nondistended, no hepatosplenomegaly or masses, no abdominal bruits or hernia , no rebound or guarding.  Abdominal pain throughout the mid abdomen more notable in the upper abdomen, mild to moderate. Extremities: No lower extremity edema. No clubbing or deformities. Neuro: Alert and oriented x 4   Skin: Warm and dry, no jaundice.   Psych: Alert and cooperative, normal mood and affect.  Labs:  Lab Results  Component Value Date   CREATININE 0.59 10/13/2019   BUN 14 10/13/2019   NA 138 10/13/2019   K 4.2 10/13/2019   CL 104 10/13/2019   CO2 24 10/13/2019   No results found for: ALT, AST, GGT, ALKPHOS, BILITOT Lab Results  Component Value Date   WBC 6.8 10/13/2019   HGB 12.5 10/13/2019   HCT 40.9 10/13/2019   MCV 84.5 10/13/2019   PLT 146 (L) 10/13/2019   Lab Results  Component Value Date   FERRITIN 13 07/12/2019   Lab Results  Component Value Date   VITAMINB12 350 07/12/2019   No results found for: FOLATE  Imaging Studies: No results found.  Impression: 65 year old female with history of ascending aortic aneurysm repair, aortic valve repair, squamous cell carcinoma of the lung status post right lung lobectomy presenting for follow-up of change in bowels/constipation, iron deficiency anemia, abdominal pain.  Constipation: Currently taking MiraLAX daily and Dulcolax soft chews (magnesium based) on daily basis.  If she misses a dose she gets backed up.  Symptoms associated with abdominal pain in the mid abdomen but predominantly in the upper abdomen.  Crampy in nature at times.  Sometimes improves after bowel movement but not always.  Has a lot of rumbling and bubbling in the stomach which makes her feel uneasy.  Complains of malodorous gas and stool.  Recent EGD and colonoscopy findings reassuring.  Constipation remains less than adequately managed.  Abdominal pain: In mid abdomen but predominance of upper abdominal pain.  Some relationship to bowel function.  Profound IDA not  clearly explained with recent EGD and colonoscopy findings.  Extensive history of aortic aneurysm repair, thoracic region previously.  Recommend CT abdomen and pelvis for further evaluation of her pain, anemia, bowel issues.  Need to exclude vascular process as well.  Update labs.  IDA: Overdue for follow-up labs.  Craving ice again.  If evidence of persistent IDA, she will need to have further work-up.  Consider small bowel capsule endoscopy to complete GI evaluation of IDA.  Plan:  1. Stop MiraLAX and Dulcolax soft chews. 2. Start Linzess 72 mcg daily on empty stomach.  Samples provided.  She will call if effective and we can send in a prescription or adjust the dose if needed. 3. Continue probiotic daily. 4. Labs follow-up on anemia and evaluate abdominal pain. 5. CT abdomen pelvis with contrast to evaluate abdominal pain, IDA, change in bowels/constipation.

## 2020-03-19 NOTE — Patient Instructions (Addendum)
1. Stop MiraLAX and Dulcolax. 2. Continue probiotic daily. 3. Start Linzess 72 mcg daily.  Take on empty stomach. 4. Please have your labs and CT scan done.  We will be in touch with results as available.

## 2020-03-19 NOTE — Progress Notes (Signed)
Cc'ed to pcp °

## 2020-03-19 NOTE — Telephone Encounter (Signed)
PA for CT A/P approved via [employer] website. Auth# [id number] DOS 03/19/2020-09/15/2020

## 2020-03-22 ENCOUNTER — Other Ambulatory Visit: Payer: Self-pay | Admitting: *Deleted

## 2020-03-22 DIAGNOSIS — D509 Iron deficiency anemia, unspecified: Secondary | ICD-10-CM

## 2020-03-22 DIAGNOSIS — K5900 Constipation, unspecified: Secondary | ICD-10-CM

## 2020-03-22 DIAGNOSIS — D649 Anemia, unspecified: Secondary | ICD-10-CM

## 2020-03-22 DIAGNOSIS — Z1211 Encounter for screening for malignant neoplasm of colon: Secondary | ICD-10-CM

## 2020-03-22 DIAGNOSIS — R1010 Upper abdominal pain, unspecified: Secondary | ICD-10-CM

## 2020-04-09 ENCOUNTER — Telehealth: Payer: Self-pay | Admitting: *Deleted

## 2020-04-09 NOTE — Telephone Encounter (Signed)
Received call from preservice center [place]. She states patient has out of state medicaid and it requires PA. She provided # to call [phone] to call.  I called # and currently not in. I called patient to verify and see what type of medicaid out of state she has as no card is in chart. She states she no longer has medicaid, she doesn't even have a card. The last letter she received a month ago was it was being dropped. That she knows of the only insurance she has is her [employer] Patient did not know what type of medicaid it was.  I called Tillie Rung to make aware of what patient stated. She states since patient medicaid is e-verifying we still have to get an [place] for it.

## 2020-04-09 NOTE — Telephone Encounter (Signed)
I had front desk look at her out of state medicaid and it is a plan first which is family planning only. Catherine Nichols was sent a message

## 2020-04-10 ENCOUNTER — Other Ambulatory Visit: Payer: Self-pay

## 2020-04-10 ENCOUNTER — Ambulatory Visit (HOSPITAL_COMMUNITY)
Admission: RE | Admit: 2020-04-10 | Discharge: 2020-04-10 | Disposition: A | Discharge status: Home / Self Care | Payer: Medicare HMO | Source: Ambulatory Visit | Attending: Gastroenterology | Admitting: Gastroenterology

## 2020-04-10 DIAGNOSIS — K5900 Constipation, unspecified: Secondary | ICD-10-CM | POA: Diagnosis not present

## 2020-04-10 DIAGNOSIS — R1010 Upper abdominal pain, unspecified: Secondary | ICD-10-CM | POA: Diagnosis present

## 2020-04-10 DIAGNOSIS — D509 Iron deficiency anemia, unspecified: Secondary | ICD-10-CM | POA: Insufficient documentation

## 2020-04-10 MED ORDER — IOHEXOL 300 MG/ML  SOLN
100.0000 mL | Freq: Once | INTRAMUSCULAR | Status: AC | PRN
  Administered 2020-04-10: 100 mL via INTRAVENOUS

## 2020-04-16 ENCOUNTER — Telehealth: Payer: Self-pay | Admitting: Internal Medicine

## 2020-04-16 NOTE — Telephone Encounter (Signed)
Routing message to provider. Pt is inquiring about results.

## 2020-04-16 NOTE — Telephone Encounter (Signed)
Pt had a CT done last Wednesday and was checking on the results. Please call [phone]

## 2020-04-17 NOTE — Telephone Encounter (Signed)
See result note.  

## 2020-04-17 NOTE — Telephone Encounter (Signed)
Noted  

## 2020-05-06 ENCOUNTER — Ambulatory Visit: Payer: Medicare PPO | Admitting: Cardiology

## 2020-06-05 ENCOUNTER — Encounter: Payer: Self-pay | Admitting: Internal Medicine

## 2020-06-10 ENCOUNTER — Emergency Department (HOSPITAL_COMMUNITY)
Admission: EM | Admit: 2020-06-10 | Discharge: 2020-06-10 | Disposition: A | Discharge status: Home / Self Care | Payer: Medicare HMO | Attending: Emergency Medicine | Admitting: Emergency Medicine

## 2020-06-10 ENCOUNTER — Emergency Department (HOSPITAL_COMMUNITY): Payer: Medicare HMO

## 2020-06-10 ENCOUNTER — Encounter (HOSPITAL_COMMUNITY): Payer: Self-pay

## 2020-06-10 ENCOUNTER — Other Ambulatory Visit: Payer: Self-pay

## 2020-06-10 DIAGNOSIS — I10 Essential (primary) hypertension: Secondary | ICD-10-CM | POA: Insufficient documentation

## 2020-06-10 DIAGNOSIS — R002 Palpitations: Secondary | ICD-10-CM | POA: Diagnosis not present

## 2020-06-10 DIAGNOSIS — Z7982 Long term (current) use of aspirin: Secondary | ICD-10-CM | POA: Insufficient documentation

## 2020-06-10 DIAGNOSIS — Z87891 Personal history of nicotine dependence: Secondary | ICD-10-CM | POA: Insufficient documentation

## 2020-06-10 DIAGNOSIS — Z79899 Other long term (current) drug therapy: Secondary | ICD-10-CM | POA: Insufficient documentation

## 2020-06-10 DIAGNOSIS — Z20822 Contact with and (suspected) exposure to covid-19: Secondary | ICD-10-CM | POA: Insufficient documentation

## 2020-06-10 LAB — MAGNESIUM: Magnesium: 1.9 mg/dL (ref 1.7–2.4)

## 2020-06-10 LAB — TROPONIN I (HIGH SENSITIVITY): Troponin I (High Sensitivity): 4 ng/L (ref <18)

## 2020-06-10 LAB — BASIC METABOLIC PANEL
Anion gap: 11 (ref 5–15)
BUN: 13 mg/dL (ref 8–23)
CO2: 26 mmol/L (ref 22–32)
Calcium: 9.6 mg/dL (ref 8.9–10.3)
Chloride: 102 mmol/L (ref 98–111)
Creatinine, Ser: 0.68 mg/dL (ref 0.44–1.00)
GFR, Estimated: >60 mL/min (ref >60)
Glucose, Bld: 82 mg/dL (ref 70–99)
Potassium: 4.1 mmol/L (ref 3.5–5.1)
Sodium: 139 mmol/L (ref 135–145)

## 2020-06-10 LAB — CBC
HCT: 42.5 % (ref 36.0–46.0)
Hemoglobin: 13.7 g/dL (ref 12.0–15.0)
MCH: 29.5 pg (ref 26.0–34.0)
MCHC: 32.2 g/dL (ref 30.0–36.0)
MCV: 91.4 fL (ref 80.0–100.0)
Platelets: 209 10*3/uL (ref 150–400)
RBC: 4.65 MIL/uL (ref 3.87–5.11)
RDW: 13.2 % (ref 11.5–15.5)
WBC: 9.9 10*3/uL (ref 4.0–10.5)
nRBC: 0 % (ref 0.0–0.2)

## 2020-06-10 LAB — RESP PANEL BY RT-PCR (FLU A&B, COVID) ARPGX2
Influenza A by PCR: NEGATIVE
Influenza B by PCR: NEGATIVE
SARS Coronavirus 2 by RT PCR: NEGATIVE

## 2020-06-10 LAB — TSH: TSH: 0.854 u[IU]/mL (ref 0.350–4.500)

## 2020-06-10 MED ORDER — METOPROLOL TARTRATE 100 MG PO TABS: 100.0000 mg | ORAL_TABLET | Freq: Two times a day (BID) | ORAL | 0 refills | Status: AC

## 2020-06-10 NOTE — ED Notes (Signed)
Dr Gilford Raid in for [facility] prior to triage complete.

## 2020-06-10 NOTE — ED Notes (Signed)
Patient seen in the ED tonight for palpitations, discharged home, VSS, ambulatory with steady gait. Patient verbalized understanding of discharge instructions, medications and return parameters.

## 2020-06-10 NOTE — Discharge Instructions (Addendum)
Increase metoprolol to 100 mg twice a day.

## 2020-06-10 NOTE — ED Triage Notes (Signed)
Pt presents to ED and states she has been having heart palpitations for a couple of weeks, denies chest pain.

## 2020-06-10 NOTE — ED Notes (Signed)
Pt transported to xray 

## 2020-06-10 NOTE — ED Provider Notes (Signed)
[facility] EMERGENCY DEPARTMENT Provider Note   CSN: [id number] Arrival date & time: 06/10/20  1749     History No chief complaint on file.   Catherine Nichols is a 65 y.o. female.  Pt presents to the ED today with palpitations.  She said they have been going on for a few weeks.  She denies feeling them now.  No cp.  No sob.  She feels like they have been worse in the last 1-2 weeks.  She feels like her heart is irregular.  Pt has seen cards for her palpitations.  Last visit was in November.  Hx atrial and ventricular ectopy with brief burst of SVT.  No afib.  Lopressor was increased to 75 mg bid.  Pt said she has been taking her medication.         Past Medical History:  Diagnosis Date  . Aortic regurgitation    Status post aortic valve repair with a #50mm HAART 300 aortic valve ring annuloplasty December 2018 - [facility]  . Ascending aortic aneurysm [facility])    Status post ascending aortic and total arch replacement with a #63mm [place] multibranch arch graft December 2018 - [facility]  . Essential hypertension   . GERD (gastroesophageal reflux disease)   . IBS (irritable bowel syndrome)   . Infection due to trichomonas   . PAD (peripheral artery disease) ([place])   . 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 Uterine fibroid     Patient Active Problem List   Diagnosis Date Noted  . Abdominal pain 03/19/2020  . IDA (iron deficiency anemia) 03/19/2020  . Anemia 07/10/2019  . Colon cancer screening 07/10/2019  . Constipation 07/10/2019  . SHOULDER PAIN, RIGHT, CHRONIC 06/03/2009  . NECK PAIN 06/03/2009  . NICOTINE ADDICTION 05/06/2009  . GERD 05/06/2009  . LEG PAIN, BILATERAL 05/06/2009  . CERVICAL MUSCLE STRAIN 05/06/2009    Past Surgical History:  Procedure Laterality Date  . ABDOMINAL HYSTERECTOMY    . AORTIC VALVE  REPAIR    . BIOPSY  10/16/2019   Procedure: BIOPSY;  Surgeon: Daneil Dolin, MD;  Location: [facility] ENDO SUITE;  Service: Endoscopy;;  . Bladder repair for prolapse  20 years ago  . COLONOSCOPY  01/12/2013   Dr. West Carbo; Normal colonoscopy s/p biopsy to rule out microscopic colitis.  Pathology benign.  . COLONOSCOPY WITH PROPOFOL N/A 10/16/2019   Rourk: Diverticulosis, seven 2 to 8 mm polyps removed (multiple tubular adenomas), next colonoscopy in 3 years  . ESOPHAGOGASTRODUODENOSCOPY  12/2017   [facility]; Surgeon: Don Perking, MD; significant esophagitis treated with Fluconazole, PPI, and Carafate  . ESOPHAGOGASTRODUODENOSCOPY  01/12/2013   Dr. West Carbo; Normal exam s/p biopsy of duodenum to rule out celiac disease.  Pathology with increased intraepithelial lymphocytes.  States this could be an early indication of celiac disease but blunted villi are not seen.  . ESOPHAGOGASTRODUODENOSCOPY (EGD) WITH PROPOFOL N/A 10/16/2019   Rourk: Erythematous mucosa in the stomach.  Gastric biopsies benign.  No H. pylori.  . LUNG LOBECTOMY Right 12/2017   for SCC  . POLYPECTOMY  10/16/2019   Procedure: POLYPECTOMY;  Surgeon: Daneil Dolin, MD;  Location: [facility] SUITE;  Service: Endoscopy;;  . THORACIC AORTIC ANEURYSM REPAIR  02/2016   x2  . TUBAL LIGATION       OB History  Gravida  1   Para  1   Term  1   Preterm      AB      Living  1     SAB      IAB      Ectopic      Multiple      Live Births  1           Family History  Problem Relation Age of Onset  . Alzheimer's disease Mother   . Prostate cancer Father   . Colon cancer Cousin        Passsed at age 5    Social History   Tobacco Use  . Smoking status: Former Smoker    Types: Cigarettes  . Smokeless tobacco: Never Used  . Tobacco comment: vape  Substance Use Topics  . Alcohol use: Yes    Alcohol/week: 0.0 standard drinks    Comment: occ  . Drug use: No    Home Medications Prior to Admission medications    Medication Sig Start Date End Date Taking? Authorizing Provider  acetaminophen (TYLENOL) 500 MG tablet Take 500 mg by mouth every 6 (six) hours as needed for mild pain or moderate pain.     [provider]  ALPRAZolam Duanne Moron) 0.5 MG tablet Take 0.5-1 mg by mouth daily as needed for anxiety.  04/20/19   [provider]  Apoaequorin (PREVAGEN) 10 MG CAPS Take by mouth daily.    [provider]  esomeprazole (NEXIUM) 40 MG capsule Take 40 mg by mouth daily. 04/20/19   [provider]  fexofenadine (ALLEGRA) 180 MG tablet Take 180 mg by mouth at bedtime as needed for allergies.  04/20/19   [provider]  furosemide (LASIX) 20 MG tablet Take 20 mg by mouth daily as needed for fluid.  05/03/19   [provider]  linaclotide (LINZESS) 72 MCG capsule Take 1 capsule (72 mcg total) by mouth daily. 03/19/20   Mahala Menghini, PA-C  metoprolol tartrate (LOPRESSOR) 100 MG tablet Take 1 tablet (100 mg total) by mouth 2 (two) times daily. 06/10/20   [name], MD  Probiotic Product (PROBIOTIC DAILY PO) Take 1 capsule by mouth daily.    [provider]    Allergies    Aspirin  Review of Systems   Review of Systems  Cardiovascular: Positive for palpitations.  All other systems reviewed and are negative.   Physical Exam Updated Vital Signs BP (!) 183/54   Pulse 70   Temp 98.4 F (36.9 C) (Oral)   Resp (!) 22   Ht 5\' 6"  (1.676 m)   Wt 61.2 kg   LMP 03/09/1998   SpO2 98%   BMI 21.79 kg/m   Physical Exam Vitals and nursing note reviewed.  Constitutional:      Appearance: Normal appearance.  HENT:     Head: Normocephalic and atraumatic.     Right Ear: External ear normal.     Left Ear: External ear normal.     Nose: Nose normal.     Mouth/Throat:     Mouth: Mucous membranes are moist.     Pharynx: Oropharynx is clear.  Eyes:     Extraocular Movements: Extraocular movements intact.     Conjunctiva/sclera: Conjunctivae normal.      Pupils: Pupils are equal, round, and reactive to light.  Cardiovascular:     Rate and Rhythm: Normal rate. Rhythm irregular.     Pulses: Normal pulses.     Heart  sounds: Normal heart sounds.  Pulmonary:     Effort: Pulmonary effort is normal.     Breath sounds: Normal breath sounds.  Abdominal:     General: Abdomen is flat. Bowel sounds are normal.     Palpations: Abdomen is soft.  Musculoskeletal:        General: Normal range of motion.     Cervical back: Normal range of motion and neck supple.  Skin:    General: Skin is warm.     Capillary Refill: Capillary refill takes less than 2 seconds.  Neurological:     General: No focal deficit present.     Mental Status: She is alert and oriented to person, place, and time.  Psychiatric:        Mood and Affect: Mood normal.        Behavior: Behavior normal.        Thought Content: Thought content normal.        Judgment: Judgment normal.     ED Results / Procedures / Treatments   Labs (all labs ordered are listed, but only abnormal results are displayed) Labs Reviewed  RESP PANEL BY RT-PCR (FLU A&B, COVID) [id number]  BASIC METABOLIC PANEL  CBC  TSH  MAGNESIUM  TROPONIN I (HIGH SENSITIVITY)  TROPONIN I (HIGH SENSITIVITY)    EKG EKG Interpretation  Date/Time:  Monday June 10 2020 18:18:23 EDT Ventricular Rate:  71 PR Interval:  179 QRS Duration: 97 QT Interval:  400 QTC Calculation: 435 R Axis:   48 Text Interpretation: Sinus rhythm Atrial premature complex LAE, consider biatrial enlargement Probable anteroseptal infarct, old Confirmed by [name] [phone]) on 06/10/2020 6:36:16 PM   Radiology DG Chest 2 View  Result Date: 06/10/2020 CLINICAL DATA:  Palpitations EXAM: CHEST - 2 VIEW COMPARISON:  05/16/2018 FINDINGS: Prior CABG. Heart is borderline in size. Lungs clear. No effusions or edema. No acute bony abnormality. Tortuous aorta. IMPRESSION: No active cardiopulmonary disease. Electronically Signed   By: Rolm Baptise M.D.   On: 06/10/2020 19:03    Procedures Procedures   Medications Ordered in ED Medications - No data to display  ED Course  I have reviewed the triage vital signs and the nursing notes.  Pertinent labs & imaging results that were available during my care of the patient were reviewed by me and considered in my medical decision making (see chart for details).    MDM Rules/Calculators/A&P                         Covid negative.  Pt's evaluation has been negative.  Pt has had a full eval for palpitations.  She has had some svt as well as atrial and ventricular ectopy.  I will increase her lopressor to 100 mg bid as her bp is elevated and she can tolerate it.  She has an appt with her cardiologist on Thursday the 7th.  She is to return if worse.  F/u with pcp. Final Clinical Impression(s) / ED Diagnoses Final diagnoses:  Palpitations    Rx / DC Orders ED Discharge Orders         Ordered    metoprolol tartrate (LOPRESSOR) 100 MG tablet  2 times daily       Note to Pharmacy: 01/30/2020 dose increase   06/10/20 2015           [name], MD 06/10/20 2020

## 2020-06-11 NOTE — Progress Notes (Signed)
Cardiology Office Note  Date: 06/13/2020   ID: Catherine Nichols, DOB [birth year], MRN [id number]  PCP:  Celene Squibb, MD  Cardiologist:  Rozann Lesches, MD Electrophysiologist:  None   Chief Complaint  Patient presents with  . Cardiac follow-up    History of Present Illness: Catherine Nichols is a 65 y.o. female last seen in November 2021.  She presents for a follow-up visit.  She states that she has been having worsening sense of palpitations, feeling of a skip, sometimes can see a pulsation up in her neck.  She has had no chest pain or syncope.  She was seen at the [facility] on April 4 due to worsening sense of palpitations.  No significant arrhythmias were noted, ECG reviewed below.  Lab work reviewed.  Chest x-ray showed no acute findings.  Lopressor was increased to 100 mg twice daily.  Cardiac monitor in August of last year showed sinus rhythm with occasional PACs and a burst of SVT lasting 7 beats.  She tells me that she is worried about her palpitations, mainly concerned about the status of her aneurysm repair - this seems to cause her a lot of anxiety based on discussion today.  She had an echocardiogram done last year that showed evidence of aortic valve repair (#96mm HAART 300 aortic valve ring annuloplasty) associated with moderate aortic regurgitation.  Chest CTA done in January 2021 at [facility] outlined below.  She states that she would like to follow locally given difficulty with transportation to [facility].   Past Medical History:  Diagnosis Date  . Aortic regurgitation    Status post aortic valve repair with a #25mm HAART 300 aortic valve ring annuloplasty December 2018 - [facility]  . Ascending aortic aneurysm [facility])    Status post ascending aortic and total arch replacement with a #63mm [place] multibranch arch graft December 2018 - [facility]  . Essential hypertension   . GERD (gastroesophageal reflux disease)   . IBS (irritable bowel syndrome)   . Infection due to  trichomonas   . PAD (peripheral artery disease) ([place])   . Pseudoaneurysm of aortic arch [facility])    Status post repair August 2019 - [facility]  . 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 Uterine fibroid     Past Surgical History:  Procedure Laterality Date  . ABDOMINAL HYSTERECTOMY    . AORTIC VALVE REPAIR    . BIOPSY  10/16/2019   Procedure: BIOPSY;  Surgeon: Daneil Dolin, MD;  Location: [facility];  Service: Endoscopy;;  . Bladder repair for prolapse  20 years ago  . COLONOSCOPY  01/12/2013   Dr. West Carbo; Normal colonoscopy s/p biopsy to rule out microscopic colitis.  Pathology benign.  . COLONOSCOPY WITH PROPOFOL N/A 10/16/2019   Rourk: Diverticulosis, seven 2 to 8 mm polyps removed (multiple tubular adenomas), next colonoscopy in 3 years  . ESOPHAGOGASTRODUODENOSCOPY  12/2017   [facility]; Surgeon: Don Perking, MD; significant esophagitis treated with Fluconazole, PPI, and Carafate  . ESOPHAGOGASTRODUODENOSCOPY  01/12/2013   Dr. West Carbo; Normal exam s/p biopsy of duodenum to rule out celiac disease.  Pathology with increased intraepithelial lymphocytes.  States this could be an early indication of celiac disease but blunted villi are not seen.  . ESOPHAGOGASTRODUODENOSCOPY (EGD) WITH PROPOFOL N/A 10/16/2019   Rourk: Erythematous mucosa in the stomach.  Gastric biopsies benign.  No H. pylori.  . LUNG LOBECTOMY Right 12/2017  for SCC  . POLYPECTOMY  10/16/2019   Procedure: POLYPECTOMY;  Surgeon: Daneil Dolin, MD;  Location: [facility];  Service: Endoscopy;;  . THORACIC AORTIC ANEURYSM REPAIR  02/2016   x2  . TUBAL LIGATION      Current Outpatient Medications  Medication Sig Dispense Refill  . acetaminophen (TYLENOL) 500 MG tablet Take 500 mg by mouth every 6 (six) hours as needed for mild pain or moderate pain.     Marland Kitchen ALPRAZolam (XANAX) 0.5 MG tablet Take 0.5-1 mg by mouth daily as needed for anxiety.     Marland Kitchen  Apoaequorin (PREVAGEN) 10 MG CAPS Take by mouth daily.    Marland Kitchen esomeprazole (NEXIUM) 40 MG capsule Take 40 mg by mouth daily.    . fexofenadine (ALLEGRA) 180 MG tablet Take 180 mg by mouth at bedtime as needed for allergies.     . furosemide (LASIX) 20 MG tablet Take 20 mg by mouth daily as needed for fluid.     . metoprolol tartrate (LOPRESSOR) 100 MG tablet Take 1 tablet (100 mg total) by mouth 2 (two) times daily. 60 tablet 0  . Probiotic Product (PROBIOTIC DAILY PO) Take 1 capsule by mouth daily.     No current facility-administered medications for this visit.   Allergies:  Aspirin   ROS: No syncope.  Physical Exam: VS:  BP (!) 90/52   Pulse (!) 57   Ht 5\' 6"  (1.676 m)   Wt 133 lb (60.3 kg)   LMP 03/09/1998   SpO2 93%   BMI 21.47 kg/m , BMI Body mass index is 21.47 kg/m.  Wt Readings from Last 3 Encounters:  06/13/20 133 lb (60.3 kg)  06/10/20 135 lb (61.2 kg)  03/19/20 134 lb (60.8 kg)    General: Patient appears comfortable at rest. HEENT: Conjunctiva and lids normal, wearing a mask. Neck: Supple, no elevated JVP or carotid bruits, no thyromegaly. Lungs: Clear to auscultation, nonlabored breathing at rest. Cardiac: Regular rate and rhythm with rare ectopic beat, no S3, 3/6 systolic and 5-0/2 diastolic murmurs. Abdomen: Soft, bowel sounds present. Extremities: No pitting edema, distal pulses 2+.  ECG:  An ECG dated 06/10/2020 was personally reviewed today and demonstrated:  Sinus rhythm with PACs, left atrial enlargement, poor R wave progression.  Recent Labwork: 03/19/2020: ALT 15; AST 25 06/10/2020: BUN 13; Creatinine, Ser 0.68; Hemoglobin 13.7; Magnesium 1.9; Platelets 209; Potassium 4.1; Sodium 139; TSH 0.854   Other Studies Reviewed Today:  Chest CTA 03/17/2019 ([facility]): Findings:  CTA Chest:  Status post aortic valve replacement and ascending aortic graft placement  repair. The graft is intact with expected postsurgical changes. No  dissection, aneurysm, IMH, or  pseudoaneurysm of the thoracic aorta. There  is no significant change in the size, patency or appearance of the  ascending thoracic aorta compared to recent prior. Systemic vascular wall  to wall cross sectional measurements:   Ascending thoracic aorta at the Sinus of Valsalva: 3.3 x 3.0 x 3.1 cm  Maximum dimension of tubular ascending thoracic aorta: 3.4 x 3.1 cm  Descending thoracic aorta at diaphragmatic hiatus: 3.1 x 3.1 cm   The bilateral common carotid, vertebral and subclavian arteries are patent  and without atherosclerotic calcification or luminal narrowing.   CTA Abdomen and pelvis:  Scattered atherosclerotic calcification of the abdominal aorta without  luminal narrowing or aneurysmal dilation. The proximal portions of the  celiac, SMA, IMA and bilateral renal arteries are patent and without high  grade atherosclerotic calcification. Incidentally noted replaced left  hepatic artery. The bilateral common iliac, external iliac, and common  femoral, and proximal SFA and profunda arteries are patent with minimal  atherosclerotic calcification.   CT Chest, Abdomen and Pelvis:  Thyroid: Unremarkable.  Lymph nodes: Redemonstrated prominent mediastinal lymph nodes, likely  reactive.  Lungs:Central airways patent. Status post right upper lobectomy. Scattered  atelectasis. No pleural effusion.  Heart: Stable biatrial enlargement. No appreciable coronary artery  calcifications. No pericardial effusion.   Liver: Normal contour.  Gallbladder/Biliary Tree: Unremarkable. No bile duct dilation.  Spleen: : Unremarkable.  Pancreas: Unremarkable.  Adrenals: Unremarkable.  Kidneys: Unremarkable. No hydronephrosis.  Bladder: Unremarkable.   GI Tract: Scattered diverticulosis.   Peritoneum/Mesentery/Retroperitoneum: No pathologic adenopathy. No  pneumoperitoneum. No ascites.  Soft Tissues/Bones: Soft tissues are unremarkable. Stable degenerative  changes of the spine. No aggressive  osseous lesion.   Impression:  1. Stable postsurgical changes of aortic valve replacement and ascending  aortic graft placement repair with graft intact and no evidence of  pseudoaneurysm. There is no significant change in the size, patency or  appearance of the ascending thoracic aorta compared to recent prior.  2. No evidence of abdominal aortic aneurysm.   Echocardiogram 10/25/2019: 1. Left ventricular ejection fraction, by estimation, is 65 to 70%. The  left ventricle has normal function. The left ventricle has no regional  wall motion abnormalities. Left ventricular diastolic parameters are  consistent with Grade I diastolic  dysfunction (impaired relaxation).  2. Right ventricular systolic function is normal. The right ventricular  size is normal. There is mildly elevated pulmonary artery systolic  pressure.  3. The mitral valve is normal in structure. Trivial mitral valve  regurgitation. No evidence of mitral stenosis.  4. Per medical records aortic valve repair with a #1mm HAART 300 aortic  valve ring annuloplasty. The aortic valve has been repaired/replaced.  Aortic valve regurgitation is moderate. Mild aortic valve stenosis.  5. The inferior vena cava is normal in size with greater than 50%  respiratory variability, suggesting right atrial pressure of 3 mmHg.   Cardiac monitor August 2021: ZIO AT reviewed, 2 days, 22 hours analyzed.  Predominant rhythm is sinus with heart rate ranging from 48 bpm up to 142 bpm and average heart rate 72 bpm.  Occasional PACs were noted representing 1.1% total beats.  Rare PVCs were noted representing less than 1% total beats.  One burst of SVT noted lasting 7 beats, otherwise no sustained arrhythmias or pauses.  CXR 4/4/20222: FINDINGS: Prior CABG. Heart is borderline in size. Lungs clear. No effusions or edema. No acute bony abnormality. Tortuous aorta.  IMPRESSION: No active cardiopulmonary disease.  Assessment and Plan:  1.   Recurrent palpitations.  Cardiac monitor from August of last year did show occasional PACs and a brief burst of SVT.  She had a few ectopic beats on examination today.  Agree with recent increase in Lopressor.  Probably cannot uptitrate this further given low normal blood pressure.  If symptoms persist at follow-up, would suggest repeat cardiac monitoring and then considering switch to either a different form of beta-blocker or possibly calcium channel blocker.  2. 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  She has not had consistent follow-up with [facility] since last year.  Prior chest CTA  was in January 2021, this will be updated to ensure stability.  Echocardiogram from August 2021 showed stable aortic valve repair although moderate aortic regurgitation.  Medication Adjustments/Labs and Tests Ordered: Current medicines are reviewed at length with the patient today.  Concerns regarding medicines are outlined above.   Tests Ordered: Orders Placed This Encounter  Procedures  . CT ANGIO CHEST AORTA W/CM & OR WO/CM    Medication Changes: No orders of the defined types were placed in this encounter.   Disposition:  Follow up 4 weeks in the [place] office.  Signed, Satira Sark, MD, [facility] 06/13/2020 9:01 AM    [facility] [place], [place], [place] [address] Phone: [phone]; Fax: [phone]

## 2020-06-13 ENCOUNTER — Telehealth: Payer: Self-pay | Admitting: Cardiology

## 2020-06-13 ENCOUNTER — Ambulatory Visit (INDEPENDENT_AMBULATORY_CARE_PROVIDER_SITE_OTHER): Payer: Medicare HMO | Admitting: Cardiology

## 2020-06-13 ENCOUNTER — Encounter: Payer: Self-pay | Admitting: Cardiology

## 2020-06-13 ENCOUNTER — Other Ambulatory Visit: Payer: Self-pay

## 2020-06-13 VITALS — BP 90/52 | HR 57 | Ht 66.0 in | Wt 133.0 lb

## 2020-06-13 DIAGNOSIS — I712 Thoracic aortic aneurysm, without rupture, unspecified: Secondary | ICD-10-CM

## 2020-06-13 DIAGNOSIS — I491 Atrial premature depolarization: Secondary | ICD-10-CM

## 2020-06-13 DIAGNOSIS — R002 Palpitations: Secondary | ICD-10-CM

## 2020-06-13 NOTE — Patient Instructions (Addendum)
Medication Instructions:   Your physician recommends that you continue on your current medications as directed. Please refer to the Current Medication list given to you today.  Labwork:  none  Testing/Procedures:  CTA chest & aorta  Follow-Up:  Your physician recommends that you schedule a follow-up appointment in: 1 month  Any Other Special Instructions Will Be Listed Below (If Applicable).  If you need a refill on your cardiac medications before your next appointment, please call your pharmacy.

## 2020-06-13 NOTE — Telephone Encounter (Signed)
Pre-cert Verification for the following procedure     CT ANGIO CHEST AORTA W/CM &/OR   DATE:  07/10/2020  LOCATION: [facility]

## 2020-06-18 ENCOUNTER — Other Ambulatory Visit: Payer: Self-pay

## 2020-06-18 DIAGNOSIS — R1010 Upper abdominal pain, unspecified: Secondary | ICD-10-CM

## 2020-06-18 DIAGNOSIS — D649 Anemia, unspecified: Secondary | ICD-10-CM

## 2020-06-18 DIAGNOSIS — K5900 Constipation, unspecified: Secondary | ICD-10-CM

## 2020-06-18 DIAGNOSIS — D509 Iron deficiency anemia, unspecified: Secondary | ICD-10-CM

## 2020-06-18 DIAGNOSIS — Z1211 Encounter for screening for malignant neoplasm of colon: Secondary | ICD-10-CM

## 2020-07-10 ENCOUNTER — Ambulatory Visit (HOSPITAL_COMMUNITY)
Admission: RE | Admit: 2020-07-10 | Discharge: 2020-07-10 | Disposition: A | Discharge status: Home / Self Care | Payer: Medicare HMO | Source: Ambulatory Visit | Attending: Cardiology | Admitting: Cardiology

## 2020-07-10 DIAGNOSIS — I712 Thoracic aortic aneurysm, without rupture, unspecified: Secondary | ICD-10-CM

## 2020-07-10 MED ORDER — IOHEXOL 350 MG/ML SOLN
100.0000 mL | Freq: Once | INTRAVENOUS | Status: AC | PRN
  Administered 2020-07-10: 100 mL via INTRAVENOUS

## 2020-07-11 ENCOUNTER — Other Ambulatory Visit: Payer: Self-pay

## 2020-07-11 ENCOUNTER — Encounter: Payer: Self-pay | Admitting: Student

## 2020-07-11 ENCOUNTER — Ambulatory Visit (INDEPENDENT_AMBULATORY_CARE_PROVIDER_SITE_OTHER): Payer: Medicare HMO | Admitting: Student

## 2020-07-11 VITALS — BP 120/58 | HR 56 | Ht 66.0 in | Wt 133.8 lb

## 2020-07-11 DIAGNOSIS — I359 Nonrheumatic aortic valve disorder, unspecified: Secondary | ICD-10-CM

## 2020-07-11 DIAGNOSIS — Z8679 Personal history of other diseases of the circulatory system: Secondary | ICD-10-CM

## 2020-07-11 DIAGNOSIS — R002 Palpitations: Secondary | ICD-10-CM | POA: Diagnosis not present

## 2020-07-11 DIAGNOSIS — Z9889 Other specified postprocedural states: Secondary | ICD-10-CM

## 2020-07-11 NOTE — Progress Notes (Signed)
Cardiology Office Note    Date:  07/12/2020   ID:  Catherine Nichols, DOB [birth year], MRN [id number]  PCP:  Celene Squibb, MD  Cardiologist: Rozann Lesches, MD    Chief Complaint  Patient presents with  . Follow-up    1 month visit    History of Present Illness:    Catherine Nichols is a 65 y.o. 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  She was recently examined by Dr. Domenic Polite on 06/13/2020 and reported more frequent palpitations and daily pulsation in her neck but denied any associated chest pain or syncope.Lopressor was not further titrated given her BP at 90/52 and heart rate of 57 during her visit. It was recommended that if symptoms persisted, would plan for a repeat cardiac monitor and consider medication changes based off of results. A repeat CTA was recommended as well and was obtained yesterday but has not officially resulted.    In talking with the patient today, she reports feeling as if her palpitations have improved following prior titration of Lopressor but she does notice her pulse along her neck. Says she can feel this bounding at times but does not necessarily feel palpitations in her chest. She does not consume caffeine or alcohol. Reports her blood pressure has been variable when checked at home but is well controlled at 120/58 during today's visit. No reported orthopnea, PND, lower extremity edema, or dyspnea on exertion.    Past Medical History:  Diagnosis Date  . Aortic regurgitation    Status post aortic valve repair with a #7mm HAART 300 aortic valve ring annuloplasty December 2018 - [facility]  . Ascending aortic aneurysm [facility])     Status post ascending aortic and total arch replacement with a #33mm Vascutek [place] multibranch arch graft December 2018 - [facility]  . Essential hypertension   . GERD (gastroesophageal reflux disease)   . IBS (irritable bowel syndrome)   . Infection due to trichomonas   . PAD (peripheral artery disease) ([place])   . Pseudoaneurysm of aortic arch [facility])    Status post repair August 2019 - [facility]  . 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 Uterine fibroid     Past Surgical History:  Procedure Laterality Date  . ABDOMINAL HYSTERECTOMY    . AORTIC VALVE REPAIR    . BIOPSY  10/16/2019   Procedure: BIOPSY;  Surgeon: Daneil Dolin, MD;  [facility];  Service: Endoscopy;;  . Bladder repair for prolapse  20 years ago  . COLONOSCOPY  01/12/2013   Dr. West Carbo; Normal colonoscopy s/p biopsy to rule out microscopic colitis.  Pathology benign.  . COLONOSCOPY WITH PROPOFOL N/A 10/16/2019   Rourk: Diverticulosis, seven 2 to 8 mm polyps removed (multiple tubular adenomas), next colonoscopy in 3 years  . ESOPHAGOGASTRODUODENOSCOPY  12/2017   [facility]; Surgeon: Don Perking, MD; significant esophagitis treated with Fluconazole, PPI, and Carafate  . ESOPHAGOGASTRODUODENOSCOPY  01/12/2013   Dr. West Carbo; Normal exam s/p biopsy of duodenum to rule out celiac disease.  Pathology with increased intraepithelial lymphocytes.  States this could be an early indication of celiac disease but  blunted villi are not seen.  . ESOPHAGOGASTRODUODENOSCOPY (EGD) WITH PROPOFOL N/A 10/16/2019   Rourk: Erythematous mucosa in the stomach.  Gastric biopsies benign.  No H. pylori.  . LUNG LOBECTOMY Right 12/2017   for SCC  . POLYPECTOMY  10/16/2019   Procedure: POLYPECTOMY;  Surgeon: Daneil Dolin, MD;  [facility];  Service: Endoscopy;;  . THORACIC AORTIC ANEURYSM REPAIR  02/2016   x2  . TUBAL LIGATION      Current  Medications: Outpatient Medications Prior to Visit  Medication Sig Dispense Refill  . acetaminophen (TYLENOL) 500 MG tablet Take 500 mg by mouth every 6 (six) hours as needed for mild pain or moderate pain.     Marland Kitchen ALPRAZolam (XANAX) 0.5 MG tablet Take 0.5-1 mg by mouth daily as needed for anxiety.     Marland Kitchen Apoaequorin (PREVAGEN) 10 MG CAPS Take by mouth daily.    Marland Kitchen esomeprazole (NEXIUM) 40 MG capsule Take 40 mg by mouth daily.    . fexofenadine (ALLEGRA) 180 MG tablet Take 180 mg by mouth at bedtime as needed for allergies.     . metoprolol tartrate (LOPRESSOR) 100 MG tablet Take 1 tablet (100 mg total) by mouth 2 (two) times daily. 60 tablet 0  . Probiotic Product (PROBIOTIC DAILY PO) Take 1 capsule by mouth daily.    . furosemide (LASIX) 20 MG tablet Take 20 mg by mouth daily as needed for fluid.  (Patient not taking: Reported on 07/11/2020)     No facility-administered medications prior to visit.     Allergies:   Aspirin   Social History   Socioeconomic History  . Marital status: Divorced    Spouse name: Not on file  . Number of children: Not on file  . Years of education: Not on file  . Highest education level: Not on file  Occupational History  . Not on file  Tobacco Use  . Smoking status: Former Smoker    Types: Cigarettes  . Smokeless tobacco: Never Used  . Tobacco comment: vape  Vaping Use  . Vaping Use: Never used  Substance and Sexual Activity  . Alcohol use: Not Currently    Alcohol/week: 0.0 standard drinks    Comment: occ  . Drug use: No  . Sexual activity: Not on file  Other Topics Concern  . Not on file  Social History Narrative  . Not on file   Social Determinants of Health   Financial Resource Strain: Not on file  Food Insecurity: Not on file  Transportation Needs: Not on file  Physical Activity: Not on file  Stress: Not on file  Social Connections: Not on file     Family History:  The patient's family history includes Alzheimer's disease in her  mother; Colon cancer in her cousin; Prostate cancer in her father.   Review of Systems:    Please see the history of present illness.     All other systems reviewed and are otherwise negative except as noted above.   Physical Exam:    VS:  BP (!) 120/58   Pulse (!) 56   Ht 5\' 6"  (1.676 m)   Wt 133 lb 12.8 oz (60.7 kg)   LMP 03/09/1998   SpO2 96%   BMI 21.60 kg/m    General: Well developed, well nourished,female appearing in no acute distress. Head: Normocephalic, atraumatic. Neck: No carotid bruits. JVD not elevated.  Lungs: Respirations regular and unlabored, without wheezes or rales.  Heart: Regular rate and rhythm. No S3  or S4. 2/6 systolic murmur along RUSB.  Abdomen: Appears non-distended. No obvious abdominal masses. Msk:  Strength and tone appear normal for age. No obvious joint deformities or effusions. Extremities: No clubbing or cyanosis. No lower extremity edema.  Distal pedal pulses are 2+ bilaterally. Neuro: Alert and oriented X 3. Moves all extremities spontaneously. No focal deficits noted. Psych:  Responds to questions appropriately with a normal affect. Skin: No rashes or lesions noted  Wt Readings from Last 3 Encounters:  07/11/20 133 lb 12.8 oz (60.7 kg)  06/13/20 133 lb (60.3 kg)  06/10/20 135 lb (61.2 kg)     Studies/Labs Reviewed:   EKG:  EKG is not ordered today.   Recent Labs: 03/19/2020: ALT 15 06/10/2020: BUN 13; Creatinine, Ser 0.68; Hemoglobin 13.7; Magnesium 1.9; Platelets 209; Potassium 4.1; Sodium 139; TSH 0.854   Lipid Panel No results found for: CHOL, TRIG, HDL, CHOLHDL, VLDL, LDLCALC, LDLDIRECT  Additional studies/ records that were reviewed today include:   Echocardiogram: 10/25/2019 IMPRESSIONS    1. Left ventricular ejection fraction, by estimation, is 65 to 70%. The  left ventricle has normal function. The left ventricle has no regional  wall motion abnormalities. Left ventricular diastolic parameters are  consistent with  Grade I diastolic  dysfunction (impaired relaxation).  2. Right ventricular systolic function is normal. The right ventricular  size is normal. There is mildly elevated pulmonary artery systolic  pressure.  3. The mitral valve is normal in structure. Trivial mitral valve  regurgitation. No evidence of mitral stenosis.  4. Per medical records aortic valve repair with a #43mm HAART 300 aortic  valve ring annuloplasty. The aortic valve has been repaired/replaced.  Aortic valve regurgitation is moderate. Mild aortic valve stenosis.  5. The inferior vena cava is normal in size with greater than 50%  respiratory variability, suggesting right atrial pressure of 3 mmHg.   Event Monitor: 10/2019 ZIO AT reviewed, 2 days, 22 hours analyzed.  Predominant rhythm is sinus with heart rate ranging from 48 bpm up to 142 bpm and average heart rate 72 bpm.  Occasional PACs were noted representing 1.1% total beats.  Rare PVCs were noted representing less than 1% total beats.  One burst of SVT noted lasting 7 beats, otherwise no sustained arrhythmias or pauses.  CTA: 03/2019 Impression:  1. Stable postsurgical changes of aortic valve replacement and ascending  aortic graft placement repair with graft intact and no evidence of  pseudoaneurysm. There is no significant change in the size, patency or  appearance of the ascending thoracic aorta compared to recent prior.  2. No evidence of abdominal aortic aneurysm.     Assessment:    1. Aortic valve disease   2. Status post ascending aortic aneurysm repair   3. Palpitations      Plan:   In order of problems listed above:  1. 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 - Echo in 10/2019 showed moderate AI and mild AS. A follow-up CTA was ordered at her prior visit  and performed yesterday but has not officially resulted. Our office will reach out to the patient once results are available.   2. Palpitations - She denies any specific palpitations in her chest but mostly feels her pulse bounding at times along her clavicle and neck. CTA pending as outlined above.  - We discussed a repeat  monitor but she wishes to hold off on this currently unless symptoms worsen. We also reviewed possibly switching to a different BB or switching Lopressor to Cardizem but she wants to see how she continues to respond to the higher dose of Lopressor. Continue Lopressor 100mg  BID for now.    Medication Adjustments/Labs and Tests Ordered: Current medicines are reviewed at length with the patient today.  Concerns regarding medicines are outlined above.  Medication changes, Labs and Tests ordered today are listed in the Patient Instructions below. Patient Instructions  Medication Instructions:  Your physician recommends that you continue on your current medications as directed. Please refer to the Current Medication list given to you today.  *If you need a refill on your cardiac medications before your next appointment, please call your pharmacy*   Lab Work: NONE   If you have labs (blood work) drawn today and your tests are completely normal, you will receive your results only by: Marland Kitchen MyChart Message (if you have MyChart) OR . A paper copy in the mail If you have any lab test that is abnormal or we need to change your treatment, we will call you to review the results.   Testing/Procedures: NONE    Follow-Up: At [facility], you and your health needs are our priority.  As part of our continuing mission to provide you with exceptional heart care, we have created designated Provider Care Teams.  These Care Teams include your primary Cardiologist (physician) and Advanced Practice Providers (APPs -  Physician Assistants and Nurse Practitioners) who all work together to  provide you with the care you need, when you need it.  We recommend signing up for the patient portal called "MyChart".  Sign up information is provided on this After Visit Summary.  MyChart is used to connect with patients for Virtual Visits (Telemedicine).  Patients are able to view lab/test results, encounter notes, upcoming appointments, etc.  Non-urgent messages can be sent to your provider as well.   To learn more about what you can do with MyChart, go to [email or web address].    Your next appointment:   6 month(s)  The format for your next appointment:   In Person  Provider:   Rozann Lesches, MD   Other Instructions Thank you for choosing [facility]!       Signed, Erma Heritage, PA-C  07/12/2020 8:48 AM    [facility] [address] [place], [place] [address] Phone: [phone] Fax: [phone]

## 2020-07-11 NOTE — Patient Instructions (Signed)
Medication Instructions:  Your physician recommends that you continue on your current medications as directed. Please refer to the Current Medication list given to you today.  *If you need a refill on your cardiac medications before your next appointment, please call your pharmacy*   Lab Work: NONE   If you have labs (blood work) drawn today and your tests are completely normal, you will receive your results only by: . MyChart Message (if you have MyChart) OR . A paper copy in the mail If you have any lab test that is abnormal or we need to change your treatment, we will call you to review the results.   Testing/Procedures: NONE    Follow-Up: At [facility], you and your health needs are our priority.  As part of our continuing mission to provide you with exceptional heart care, we have created designated Provider Care Teams.  These Care Teams include your primary Cardiologist (physician) and Advanced Practice Providers (APPs -  Physician Assistants and Nurse Practitioners) who all work together to provide you with the care you need, when you need it.  We recommend signing up for the patient portal called "MyChart".  Sign up information is provided on this After Visit Summary.  MyChart is used to connect with patients for Virtual Visits (Telemedicine).  Patients are able to view lab/test results, encounter notes, upcoming appointments, etc.  Non-urgent messages can be sent to your provider as well.   To learn more about what you can do with MyChart, go to [email or web address].    Your next appointment:   6 month(s)  The format for your next appointment:   In Person  Provider:   Samuel McDowell, MD   Other Instructions Thank you for choosing [facility]!    

## 2020-07-12 ENCOUNTER — Encounter: Payer: Self-pay | Admitting: Student

## 2020-07-12 ENCOUNTER — Telehealth: Payer: Self-pay | Admitting: *Deleted

## 2020-07-12 DIAGNOSIS — R911 Solitary pulmonary nodule: Secondary | ICD-10-CM

## 2020-07-12 NOTE — Telephone Encounter (Signed)
Radiology called to confirm provider review on CT report done on 5/4

## 2020-07-12 NOTE — Telephone Encounter (Signed)
It just resulted in my inbox this morning.

## 2020-07-12 NOTE — Telephone Encounter (Signed)
Pt voiced understanding - orders placed and will forward to schedulers      Satira Sark, MD  Merlene Laughter, RN; Erma Heritage, PA-C Results reviewed. Postoperative changes of aortic valve replacement and ascending aorta are stable. 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 She did have a nodular opacity in the left lower lobe which needs further evaluation. Please see if we can get her scheduled for a PET scan.

## 2020-07-22 ENCOUNTER — Telehealth: Payer: Self-pay | Admitting: Internal Medicine

## 2020-07-22 NOTE — Telephone Encounter (Signed)
Pt has questions about why she having labs done. Please call her at [phone]

## 2020-07-22 NOTE — Telephone Encounter (Signed)
Spoke to pt.  She was informed that labs were needed as a 4 month repeat from Jan 2022.  She was reminded that her ferritin was on the lower end of normal in Jan.  Pt is agreeable and voiced understanding.

## 2020-07-23 ENCOUNTER — Other Ambulatory Visit: Payer: Self-pay

## 2020-07-23 ENCOUNTER — Other Ambulatory Visit (HOSPITAL_COMMUNITY)
Admission: RE | Admit: 2020-07-23 | Discharge: 2020-07-23 | Disposition: A | Discharge status: Home / Self Care | Payer: Medicare HMO | Source: Ambulatory Visit | Attending: Gastroenterology | Admitting: Gastroenterology

## 2020-07-23 DIAGNOSIS — R1010 Upper abdominal pain, unspecified: Secondary | ICD-10-CM | POA: Insufficient documentation

## 2020-07-23 DIAGNOSIS — D649 Anemia, unspecified: Secondary | ICD-10-CM | POA: Insufficient documentation

## 2020-07-23 DIAGNOSIS — K5900 Constipation, unspecified: Secondary | ICD-10-CM | POA: Diagnosis present

## 2020-07-23 DIAGNOSIS — D509 Iron deficiency anemia, unspecified: Secondary | ICD-10-CM | POA: Insufficient documentation

## 2020-07-23 DIAGNOSIS — Z1211 Encounter for screening for malignant neoplasm of colon: Secondary | ICD-10-CM | POA: Diagnosis present

## 2020-07-23 LAB — CBC WITH DIFFERENTIAL/PLATELET
Abs Immature Granulocytes: 0.01 10*3/uL (ref 0.00–0.07)
Basophils Absolute: 0 10*3/uL (ref 0.0–0.1)
Basophils Relative: 0 %
Eosinophils Absolute: 0.1 10*3/uL (ref 0.0–0.5)
Eosinophils Relative: 1 %
HCT: 41.6 % (ref 36.0–46.0)
Hemoglobin: 13.2 g/dL (ref 12.0–15.0)
Immature Granulocytes: 0 %
Lymphocytes Relative: 18 %
Lymphs Abs: 1 10*3/uL (ref 0.7–4.0)
MCH: 29 pg (ref 26.0–34.0)
MCHC: 31.7 g/dL (ref 30.0–36.0)
MCV: 91.4 fL (ref 80.0–100.0)
Monocytes Absolute: 0.3 10*3/uL (ref 0.1–1.0)
Monocytes Relative: 6 %
Neutro Abs: 4.1 10*3/uL (ref 1.7–7.7)
Neutrophils Relative %: 75 %
Platelets: 171 10*3/uL (ref 150–400)
RBC: 4.55 MIL/uL (ref 3.87–5.11)
RDW: 12.8 % (ref 11.5–15.5)
WBC: 5.5 10*3/uL (ref 4.0–10.5)
nRBC: 0 % (ref 0.0–0.2)

## 2020-07-23 LAB — IRON AND TIBC
Iron: 61 ug/dL (ref 28–170)
Saturation Ratios: 16 % (ref 10.4–31.8)
TIBC: 391 ug/dL (ref 250–450)
UIBC: 330 ug/dL

## 2020-07-23 LAB — FERRITIN: Ferritin: 15 ng/mL (ref 11–307)

## 2020-07-29 ENCOUNTER — Encounter (HOSPITAL_COMMUNITY)
Admission: RE | Admit: 2020-07-29 | Discharge: 2020-07-29 | Disposition: A | Discharge status: Home / Self Care | Payer: Medicare HMO | Source: Ambulatory Visit | Attending: Cardiology | Admitting: Cardiology

## 2020-07-29 ENCOUNTER — Other Ambulatory Visit: Payer: Self-pay

## 2020-07-29 DIAGNOSIS — R911 Solitary pulmonary nodule: Secondary | ICD-10-CM | POA: Diagnosis present

## 2020-07-29 MED ORDER — FLUDEOXYGLUCOSE F - 18 (FDG) INJECTION
8.2700 | Freq: Once | INTRAVENOUS | Status: AC | PRN
  Administered 2020-07-29: 8.27 via INTRAVENOUS

## 2020-07-31 ENCOUNTER — Telehealth: Payer: Self-pay | Admitting: *Deleted

## 2020-07-31 DIAGNOSIS — R911 Solitary pulmonary nodule: Secondary | ICD-10-CM

## 2020-07-31 NOTE — Telephone Encounter (Signed)
Patient informed and verbalized understanding of plan. Voiced that she would like to see [facility]. Copy sent to PCP

## 2020-07-31 NOTE — Telephone Encounter (Signed)
-----   Message from Satira Sark, MD sent at 07/31/2020 11:47 AM EDT ----- Results reviewed.  PET scan unfortunately demonstrates areas of increased metabolic activity involving mediastinal lymph nodes and also the left lower lobe nodule and left adrenal gland.  With previous history of squamous cell lung cancer, this would be concerning for recurrence of malignancy.  She had previous care at [facility]  Please check with her to see if we can get her set up to follow-up with oncology at [facility], or whether she wants to establish with a local practice.  In that case would get her set up for oncology visit soon as possible.

## 2020-08-02 ENCOUNTER — Ambulatory Visit: Payer: Medicare HMO | Admitting: Internal Medicine

## 2020-08-07 ENCOUNTER — Ambulatory Visit: Payer: Medicare PPO | Admitting: Cardiology

## 2020-09-17 ENCOUNTER — Ambulatory Visit (INDEPENDENT_AMBULATORY_CARE_PROVIDER_SITE_OTHER): Payer: Medicare HMO | Admitting: Internal Medicine

## 2020-09-17 ENCOUNTER — Other Ambulatory Visit: Payer: Self-pay

## 2020-09-17 ENCOUNTER — Encounter: Payer: Self-pay | Admitting: Internal Medicine

## 2020-09-17 ENCOUNTER — Other Ambulatory Visit: Payer: Self-pay | Admitting: Internal Medicine

## 2020-09-17 VITALS — BP 157/60 | HR 57 | Temp 97.5°F | Ht 66.0 in | Wt 137.8 lb

## 2020-09-17 DIAGNOSIS — K219 Gastro-esophageal reflux disease without esophagitis: Secondary | ICD-10-CM | POA: Diagnosis not present

## 2020-09-17 DIAGNOSIS — K5900 Constipation, unspecified: Secondary | ICD-10-CM

## 2020-09-17 DIAGNOSIS — R1010 Upper abdominal pain, unspecified: Secondary | ICD-10-CM

## 2020-09-17 MED ORDER — POLYETHYLENE GLYCOL 3350 17 GM/SCOOP PO POWD: 1.0000 | Freq: Every day | ORAL | 3 refills | Status: DC

## 2020-09-17 MED ORDER — ESOMEPRAZOLE MAGNESIUM 40 MG PO CPDR: 40.0000 mg | DELAYED_RELEASE_CAPSULE | Freq: Every day | ORAL | 3 refills | Status: DC

## 2020-09-17 NOTE — Patient Instructions (Signed)
Stop Linzess  Begin MiraLAX 1 capful or 17 g in 8 ounces of water or lemonade orally at bedtime (call in as a prescription.  90 doses with 3 refills)  Continue Nexium at a dose of 40 mg daily (dispense 90 with 3 refills)  Office visit in 6 months  It was good seeing you again today!

## 2020-09-17 NOTE — Progress Notes (Signed)
Primary Care Physician:  Celene Squibb, MD Primary Gastroenterologist:  Dr. Gala Romney  Pre-Procedure History & Physical: HPI:  Catherine Nichols is a 65 y.o. female here for follow-up of GERD and constipation.  Was seen here the first of the year; treated with Linzess 72-much  - too strong - produced diarrhea.  She went back to a variety of OTC agents.  If she does not take something she will not go for 2 to 3 days.  History of mild non-H. pylori gastritis seen on EGD last year.  Colonoscopy yielded small adenomas.;  Due for surveillance colonoscopy 2024.  Pulmonary work-up recently demonstrated avid uptake of 2 nodules on PET scanning.  Concerning for recurrent squamous cell carcinoma.  She underwent a bronchoscopy this past Friday at [facility]; she is awaiting to hear results.  She has GERD symptoms without dysphagia.  Has been taking Nexium 20 mg daily feels she needs to go back on 40 mg daily for better control of symptoms.  Past Medical History:  Diagnosis Date   Aortic regurgitation    Status post aortic valve repair with a #24mm HAART 300 aortic valve ring annuloplasty December 2018 - [facility]   Ascending aortic aneurysm [facility])    Status post ascending aortic and total arch replacement with a #8mm Vascutek [place] multibranch arch graft December 2018 - [facility]   Essential hypertension    GERD (gastroesophageal reflux disease)    IBS (irritable bowel syndrome)    Infection due to trichomonas    PAD (peripheral artery disease) ([facility])    Pseudoaneurysm of aortic arch ([place])    Status post repair August 2019 - [facility]   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 West Carbo; Normal colonoscopy s/p biopsy to rule out microscopic colitis.  Pathology benign.   COLONOSCOPY WITH PROPOFOL N/A 10/16/2019   Quandra Fedorchak: Diverticulosis, seven 2 to 8 mm polyps removed (multiple tubular adenomas), next colonoscopy in 3 years   ESOPHAGOGASTRODUODENOSCOPY  12/2017   [facility]; Surgeon: Don Perking, MD; significant esophagitis treated with Fluconazole, PPI, and Carafate   ESOPHAGOGASTRODUODENOSCOPY  01/12/2013   Dr. West Carbo; Normal exam s/p biopsy of duodenum to rule out celiac disease.  Pathology with increased intraepithelial lymphocytes.  States this could be an early indication of celiac disease but blunted villi are not seen.   ESOPHAGOGASTRODUODENOSCOPY (EGD) WITH PROPOFOL N/A 10/16/2019   [name]: Erythematous mucosa in the stomach.  Gastric biopsies benign.  No H. pylori.   LUNG LOBECTOMY Right 12/2017   for [facility]   POLYPECTOMY  10/16/2019   Procedure: POLYPECTOMY;  Surgeon: Daneil Dolin, MD;  [facility];  Service: Endoscopy;;   THORACIC AORTIC ANEURYSM REPAIR  02/2016   x2   TUBAL LIGATION      Prior to Admission medications   Medication Sig Start Date End Date Taking? Authorizing Provider  acetaminophen (TYLENOL) 500 MG tablet Take 500 mg by mouth every 6 (six) hours as needed for mild pain or moderate pain.    Yes [provider]  ALPRAZolam (XANAX) 0.5 MG tablet Take 0.5-1 mg by mouth daily as needed for anxiety.  04/20/19  Yes [provider]  Apoaequorin (PREVAGEN) 10 MG CAPS Take by mouth daily.   Yes [provider]  esomeprazole (NEXIUM) 40 MG capsule Take 40 mg by mouth daily. 04/20/19  Yes [provider]  fexofenadine (ALLEGRA) 180 MG tablet Take 180 mg by mouth at bedtime as needed for allergies.  04/20/19  Yes [provider]  Magnesium Hydroxide (DULCOLAX SOFT CHEWS PO) Take by mouth as needed. Takes 1 chew as needed   Yes [provider]   metoprolol tartrate (LOPRESSOR) 100 MG tablet Take 1 tablet (100 mg total) by mouth 2 (two) times daily. 06/10/20  Yes [name], MD  Probiotic Product (PROBIOTIC DAILY PO) Take 1 capsule by mouth daily.   Yes [provider]    Allergies as of 09/17/2020 - Review Complete 09/17/2020  Allergen Reaction Noted   Aspirin Nausea Only 02/21/2014    Family History  Problem Relation Age of Onset   Alzheimer's disease Mother    Prostate cancer Father    Colon cancer Cousin        Passsed at age 19    Social History   Socioeconomic History   Marital status: Divorced    Spouse name: Not on file   Number of children: Not on file   Years of education: Not on file   Highest education level: Not on file  Occupational History   Not on file  Tobacco Use   Smoking status: Former    Pack years: 0.00    Types: Cigarettes   Smokeless tobacco: Never   Tobacco comments:    vape  Scientific laboratory technician Use: Never used  Substance and Sexual Activity   Alcohol use: Yes    Comment: occ   Drug use: No   Sexual activity: Not on file  Other Topics Concern   Not on file  Social History Narrative   Not on file   Social Determinants of Health   Financial Resource Strain: Not on file  Food Insecurity: Not on file  Transportation Needs: Not on file  Physical Activity: Not on file  Stress: Not on file  Social Connections: Not on file  Intimate Partner Violence: Not on file    Review of Systems: See HPI, otherwise negative ROS  Physical Exam: BP (!) 157/60   Pulse (!) 57   Temp (!) 97.5 F (36.4 C) (Temporal)   Ht 5\' 6"  (1.676 m)   Wt 137 lb 12.8 oz (62.5 kg)   LMP 03/09/1998   BMI 22.24 kg/m  General:   Alert,  pleasant and cooperative in NAD Abdomen: Non-distended, normal bowel sounds.  Soft and nontender without appreciable mass or hepatosplenomegaly.  Pulses:  Normal pulses noted. Extremities:  Without clubbing or edema.  Impression/Plan: 65 year old lady with  GERD suboptimally controlled on Nexium 20 mg daily.  Results of EGD as outlined above.  Chronic constipation Linzess was too strong for her even at the lowest dose.  No alarm symptoms.  Findings of colonoscopy as outlined.  Recommendations:  Stop Linzess  Begin MiraLAX 1 capful or 17 g in 8 ounces of water or lemonade orally at bedtime (call in as a prescription.  90 doses with 3 refills)  Continue Nexium at a dose of 40 mg daily (dispense 90 with 3 refills)  Office visit in 6 months     Notice: This dictation was prepared with  Dragon dictation along with smaller Company secretary. Any transcriptional errors that result from this process are unintentional and may not be corrected upon review.

## 2021-04-08 ENCOUNTER — Encounter: Payer: Self-pay | Admitting: Internal Medicine

## 2021-06-16 ENCOUNTER — Other Ambulatory Visit (HOSPITAL_COMMUNITY): Payer: Self-pay | Admitting: Family Medicine

## 2021-06-16 ENCOUNTER — Other Ambulatory Visit: Payer: Self-pay | Admitting: Family Medicine

## 2021-06-16 DIAGNOSIS — R109 Unspecified abdominal pain: Secondary | ICD-10-CM

## 2021-06-17 ENCOUNTER — Ambulatory Visit (HOSPITAL_COMMUNITY)
Admission: RE | Admit: 2021-06-17 | Discharge: 2021-06-17 | Disposition: A | Discharge status: Home / Self Care | Payer: Medicare HMO | Source: Ambulatory Visit | Attending: Family Medicine | Admitting: Family Medicine

## 2021-06-17 DIAGNOSIS — R109 Unspecified abdominal pain: Secondary | ICD-10-CM | POA: Diagnosis present

## 2021-06-17 MED ORDER — IOHEXOL 9 MG/ML PO SOLN
1000.0000 mL | ORAL | Status: AC
  Administered 2021-06-17: 1000 mL via ORAL

## 2021-06-17 MED ORDER — IOHEXOL 300 MG/ML  SOLN
100.0000 mL | Freq: Once | INTRAMUSCULAR | Status: AC | PRN
  Administered 2021-06-17: 100 mL via INTRAVENOUS

## 2021-06-17 MED ORDER — SODIUM CHLORIDE (PF) 0.9 % IJ SOLN
INTRAMUSCULAR | Status: AC
  Filled 2021-06-17: qty 50

## 2021-06-18 ENCOUNTER — Inpatient Hospital Stay (HOSPITAL_COMMUNITY): Admission: RE | Admit: 2021-06-18 | Payer: Medicare HMO | Source: Ambulatory Visit

## 2021-06-29 ENCOUNTER — Other Ambulatory Visit: Payer: Self-pay | Admitting: Internal Medicine

## 2021-06-29 DIAGNOSIS — R1010 Upper abdominal pain, unspecified: Secondary | ICD-10-CM

## 2021-06-29 DIAGNOSIS — K5900 Constipation, unspecified: Secondary | ICD-10-CM

## 2021-06-29 DIAGNOSIS — K219 Gastro-esophageal reflux disease without esophagitis: Secondary | ICD-10-CM

## 2021-06-30 NOTE — Telephone Encounter (Signed)
Last ov 09/17/20 ?

## 2021-08-05 ENCOUNTER — Telehealth: Payer: Self-pay | Admitting: Gastroenterology

## 2021-08-05 ENCOUNTER — Ambulatory Visit (INDEPENDENT_AMBULATORY_CARE_PROVIDER_SITE_OTHER): Payer: Medicare HMO | Admitting: Gastroenterology

## 2021-08-05 ENCOUNTER — Encounter: Payer: Self-pay | Admitting: Gastroenterology

## 2021-08-05 VITALS — BP 138/62 | HR 57 | Temp 97.5°F | Ht 66.0 in | Wt 129.2 lb

## 2021-08-05 DIAGNOSIS — K589 Irritable bowel syndrome without diarrhea: Secondary | ICD-10-CM

## 2021-08-05 DIAGNOSIS — R6881 Early satiety: Secondary | ICD-10-CM

## 2021-08-05 DIAGNOSIS — R634 Abnormal weight loss: Secondary | ICD-10-CM

## 2021-08-05 DIAGNOSIS — K219 Gastro-esophageal reflux disease without esophagitis: Secondary | ICD-10-CM | POA: Diagnosis not present

## 2021-08-05 NOTE — Patient Instructions (Signed)
Please go to [facility] at [address] [place] (across from main entrance at [facility]). We will be in touch with results as available. I will be in touch with your pharmacy regarding your medications. We will let you know if any changes need to be made.  Try Creon 2 capsules with meals (6 daily). Take at onset of eating. You can also take one capsule with a snack if you don't eat three meals a day. Samples provided. Make note if it helps your bubbling stomach, loose stools, weight loss.

## 2021-08-05 NOTE — Progress Notes (Signed)
Received records from pharmacy:  She received esomeprazole 20 mg daily, 90-day supply on Aug 05, 2021 prescribed by Dr. Allyn Kenner.  We had prescribed esomeprazole 40 mg daily on June 30, 2021 but placed on hold, "refill too soon".  Recently received azithromycin May 15, doxycycline April 25.  Received famotidine 20 mg twice daily prescribed by Terressa Koyanagi.   See separate telephone note.

## 2021-08-05 NOTE — Progress Notes (Signed)
GI Office Note    Referring Provider: Celene Squibb, MD Primary Care Physician:  Catherine Squibb, MD  Primary Gastroenterologist: Catherine Cornea, MD   Chief Complaint   Chief Complaint  Patient presents with   stomach gurgling    States that her stomach bubbles a lot, currently has loose stools.     History of Present Illness   Catherine Nichols is a 66 y.o. female presenting today for follow-up.  Last seen in July 2022.  She has a history of GERD and constipation.  Previously on Linzess 72 mcg daily was too strong producing diarrhea.  Weight is down 8 pounds since 09/2020, weighing 137 pounds.  She weighed 134 pounds back in January 2023.  Today is down to 129 pounds.  "Supposed to eat gluten free due to IBS." Tries to but not always able to. She complains of feeing full after one meal, and then doesn't get hungry for rest of day. Tries to eat high calorie food due to weight loss. Eating ice cream at night. Denies nausea or dysphagia. No abdominal pain. Feels like full after small meal and does not feel hungry until the next day. Symptoms for couple of months. Also with stomach bubbling. She admits to be worried about having cancer. Feels like the weight has come off over the past couple of months. No heartburn, taking esomeprazole two 20mg  in morning. Taking pepcid am and pm. Not sure when pepcid was added, was not on this at last ov here. She states she used to take esomeprazole 40mg  in AM but when she had to [facility] to [facility] and gave her 20mg  and told her to take two at same time. Denies NSAIDs/ASA.  Recently she felt sick, little hoarse, had watery diarrhea. Seen at urgent care. Throat irritated. No strep/flu/covid. She was given an antibiotic. Took imodium. Diarrhea got better.   BMs daily. No constipation. First thing in the morning, drinks a bottle of water. Then has BM, soft. No melena, brbpr. Currently not using the miralax. Scared to use it because has had accident  in the bed while asleep. Stools still looser than before. Also has bubbling noise but no nausea or abdominal pain and no BM with the noise.   H/o stage 1B NSCLC RUL, pulmonary nodule, LLL followed by oncology at [facility]. She had right fobot assisted upper lobectomy with intraoperative pneumolyisis, regional and mediastinal lymph node dissection in 2019. She had CT imaging by cardiology locally in 07/2020 that showed LLL nodule and was somewhat prominent lymph nodes anterior to the upper carina on the right, unclear etiology.PET scan showed hypermetabolic mediastinal lymph nodes, hypermetabolic left lower lobe nodule in the lung, mild hypermetabolic inferior body of the left adrenal gland without definite CT correlate.  Seen back in [place] in July 2022 had EBUS for FNA with pathology indicating no evidence of malignancy.  CT chest without contrast with 3D MIPS protocol at [facility] January 2023 showed right upper lobectomy findings, new tiny loculated right apical pneumothorax (clinically insignificant), stable in size left lower lobe nodule.  Plans to follow-up in July.  CT abdomen pelvis with contrast dated June 17, 2021:  IMPRESSION: 1. No acute findings in the abdomen or pelvis. Specifically, no findings to explain the patient's history of abdominal pain and constipation. 2. Left colonic diverticulosis without diverticulitis. 3. Aortic Atherosclerosis (ICD10-I70.0).  EGD August 2021: Erythematous mucosa in the stomach.  Gastric biopsies benign.  No H. pylori.  Colonoscopy  August 2021: Diverticulosis, seven 2 to 8 mm polyps removed (multiple tubular adenomas), next colonoscopy in 3 years.      Medications   Current Outpatient Medications  Medication Sig Dispense Refill   acetaminophen (TYLENOL) 500 MG tablet Take 500 mg by mouth every 6 (six) hours as needed for mild pain or moderate pain.      ALPRAZolam (XANAX) 0.5 MG tablet Take 0.5-1 mg by mouth daily as needed for anxiety.      esomeprazole  (NEXIUM) 40 MG capsule TAKE 1 CAPSULE (40 MG TOTAL) BY MOUTH DAILY FOR 90 DOSES. 90 capsule 0   famotidine (PEPCID) 20 MG tablet Take 20 mg by mouth 2 (two) times daily.     fexofenadine (ALLEGRA) 180 MG tablet Take 180 mg by mouth at bedtime as needed for allergies.      fluticasone (FLONASE) 50 MCG/ACT nasal spray Place into the nose.     Magnesium Hydroxide (DULCOLAX SOFT CHEWS PO) Take by mouth as needed. Takes 1 chew as needed     metoprolol tartrate (LOPRESSOR) 100 MG tablet Take 1 tablet (100 mg total) by mouth 2 (two) times daily. 60 tablet 0   polyethylene glycol powder (MIRALAX) 17 GM/SCOOP powder 17 grams mixed in 8 ounces of water or lemonade at bedtime daily. 1530 g 3   Probiotic Product (PROBIOTIC DAILY PO) Take 1 capsule by mouth daily.     No current facility-administered medications for this visit.   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 West Carbo; Normal colonoscopy s/p biopsy to rule out microscopic  colitis.  Pathology benign.   COLONOSCOPY WITH PROPOFOL N/A 10/16/2019   Rourk: Diverticulosis, seven 2 to 8 mm polyps removed (multiple tubular adenomas), next colonoscopy in 3 years   ESOPHAGOGASTRODUODENOSCOPY  12/2017   [facility]; Surgeon: Don Perking, MD; significant esophagitis treated with Fluconazole, PPI, and Carafate   ESOPHAGOGASTRODUODENOSCOPY  01/12/2013   Dr. West Carbo; Normal exam s/p biopsy of duodenum to rule out celiac disease.  Pathology with increased intraepithelial lymphocytes.  States this could be an early indication of celiac disease but blunted villi are not seen.   ESOPHAGOGASTRODUODENOSCOPY (EGD) WITH PROPOFOL N/A 10/16/2019   Rourk: Erythematous mucosa in the stomach.  Gastric biopsies benign.  No H. pylori.   LUNG LOBECTOMY Right 12/2017   for [facility]   POLYPECTOMY  10/16/2019   Procedure: POLYPECTOMY;  Surgeon: Gala Romney,  Cristopher Estimable, MD;  Location: [facility];  Service: Endoscopy;;   THORACIC AORTIC ANEURYSM REPAIR  02/2016   x2   TUBAL LIGATION       Allergies   Allergies as of 08/05/2021 - Review Complete 08/05/2021  Allergen Reaction Noted   Aspirin Nausea Only 02/21/2014       Review of Systems   General: Negative for fever, chills, fatigue, weakness. See hpi ENT: Negative for hoarseness, difficulty swallowing , nasal congestion. CV: Negative for chest pain, angina, palpitations, dyspnea on exertion, peripheral edema.  Respiratory: Negative for dyspnea at rest, dyspnea on exertion, cough, sputum, wheezing.  GI: See history of present illness. GU:  Negative for dysuria, hematuria, urinary incontinence, urinary frequency, nocturnal urination.  Endo: see hpi    Physical Exam   BP 138/62 (BP Location: Right Arm, Patient Position: Sitting, Cuff Size: Normal)   Pulse (!) 57   Temp (!) 97.5 F (36.4 C) (Temporal)   Ht 5\' 6"  (1.676 m)   Wt 129 lb 3.2 oz (58.6 kg)   LMP 03/09/1998   SpO2 98%   BMI 20.85 kg/m    General: Well-nourished, well-developed in no  acute distress.  Eyes: No icterus. Mouth: Oropharyngeal mucosa moist and pink , no lesions erythema or exudate. Lungs: Clear to auscultation bilaterally.  Heart: Regular rate and rhythm, no rubs or gallops. Harsh 3/6 SEM Abdomen: Bowel sounds are normal, nontender, nondistended, no hepatosplenomegaly or masses,  no abdominal bruits or hernia , no rebound or guarding.  Rectal: not performed Extremities: No lower extremity edema. No clubbing or deformities. Neuro: Alert and oriented x 4   Skin: Warm and dry, no jaundice.   Psych: Alert and cooperative, normal mood and affect.  Labs   July 2022: White blood cell count 10,300, hemoglobin 13.9, platelets 189,000.  Sodium 140, potassium 4.7, creatinine 0.7, total bilirubin 0.8, alkaline phosphatase 69, AST 26, ALT 16, albumin 4.0.  Lipase 31.  Imaging Studies   See hpi  Assessment   GERD: typical symptoms well controlled. Unclear what regimen she is on, ?also on H2 blocker. We have requested pharmacy records for review.   Early satiety: fills up after one meal and does not get hungry until the next day. She has added high calorie foods due to weight loss. No abdominal pain or vomiting. No NSAIDs/ASA. She is currently not having issues with constipation. Unclear etiology. Will check thyroid status.   Weight loss: notable weight loss over the past six months or so. Likely due to decreased consumption in setting of early satiety, diminished appetite. Given her prior history of lung cancer she is concerned about possibility of malignancy.   IBS: currently having regular stools, 1-2 daily, looser than normal. Two weeks ago, was having watery stools. Has had antibiotics a couple of times this year. Reports a lot of bubbling sounds but no BM associated with it. She has had this in the past but not as persistent. No abdominal pain. Check labs. If persistent loose stools, noise, may consider checking stool for infectious but given limited number of  stools daily, will hold off for now. Will add Creon to see if helpful, ?underlying pancreatic insufficiency.      PLAN   Check pharmacy records, patient reports nexium 20mg  tablets being provided and taking two at a time. She also reports taking Pepcid 20mg  BID. May be able to adjust meds if this is correct. Labs to be done to evaluate weight loss and fatigue.  Trial of Creon  36000, take 2 with meals and 1 with snacks up to 6 per day. Samples provided.    Laureen Ochs. Bobby Rumpf, [place], [facility]

## 2021-08-05 NOTE — Telephone Encounter (Signed)
Lmom for pharmacy to return my call.

## 2021-08-05 NOTE — Telephone Encounter (Signed)
Reviewed pharmacy records after ov today.  Patient has been receiving esomeprazole 20mg  daily by PCP. RX was sent in for refills by Dr. Gala Romney for 40 mg daily but was placed on hold, "refill too soon" on 06/30/21 but last refill today was for 20mg  again. Can we find out how to get her the 40mg  filled???  Also please let patient know outcome and I would suggest she take famotidine 20mg  BID only as needed if she has breakthrough heartburn. She does not need it all the time if she is taking esomeprazole.

## 2021-08-06 LAB — COMPREHENSIVE METABOLIC PANEL
ALT: 9 IU/L (ref 0–32)
AST: 19 IU/L (ref 0–40)
Albumin/Globulin Ratio: 1.8 (ref 1.2–2.2)
Albumin: 4.5 g/dL (ref 3.8–4.8)
Alkaline Phosphatase: 73 IU/L (ref 44–121)
BUN/Creatinine Ratio: 18 (ref 12–28)
BUN: 13 mg/dL (ref 8–27)
Bilirubin Total: 0.7 mg/dL (ref 0.0–1.2)
CO2: 23 mmol/L (ref 20–29)
Calcium: 9.8 mg/dL (ref 8.7–10.3)
Chloride: 103 mmol/L (ref 96–106)
Creatinine, Ser: 0.73 mg/dL (ref 0.57–1.00)
Globulin, Total: 2.5 g/dL (ref 1.5–4.5)
Glucose: 83 mg/dL (ref 70–99)
Potassium: 4.6 mmol/L (ref 3.5–5.2)
Sodium: 141 mmol/L (ref 134–144)
Total Protein: 7 g/dL (ref 6.0–8.5)
eGFR: 90 mL/min/{1.73_m2} (ref >59)

## 2021-08-06 LAB — CBC WITH DIFFERENTIAL/PLATELET
Basophils Absolute: 0 10*3/uL (ref 0.0–0.2)
Basos: 0 %
EOS (ABSOLUTE): 0 10*3/uL (ref 0.0–0.4)
Eos: 1 %
Hematocrit: 43.3 % (ref 34.0–46.6)
Hemoglobin: 14.5 g/dL (ref 11.1–15.9)
Immature Grans (Abs): 0 10*3/uL (ref 0.0–0.1)
Immature Granulocytes: 0 %
Lymphocytes Absolute: 1.5 10*3/uL (ref 0.7–3.1)
Lymphs: 21 %
MCH: 28.6 pg (ref 26.6–33.0)
MCHC: 33.5 g/dL (ref 31.5–35.7)
MCV: 85 fL (ref 79–97)
Monocytes Absolute: 0.3 10*3/uL (ref 0.1–0.9)
Monocytes: 5 %
Neutrophils Absolute: 5.1 10*3/uL (ref 1.4–7.0)
Neutrophils: 73 %
Platelets: 179 10*3/uL (ref 150–450)
RBC: 5.07 x10E6/uL (ref 3.77–5.28)
RDW: 12.9 % (ref 11.7–15.4)
WBC: 7 10*3/uL (ref 3.4–10.8)

## 2021-08-06 LAB — TSH+FREE T4
Free T4: 1.54 ng/dL (ref 0.82–1.77)
TSH: 0.932 u[IU]/mL (ref 0.450–4.500)

## 2021-08-06 LAB — LIPASE: Lipase: 21 U/L (ref 14–72)

## 2021-08-06 LAB — C-REACTIVE PROTEIN: CRP: <1 mg/L (ref 0–10)

## 2021-08-06 LAB — SEDIMENTATION RATE: Sed Rate: 5 mm/hr (ref 0–40)

## 2021-08-06 NOTE — Telephone Encounter (Signed)
Spoke with [facility] [place] and they have updated the patient's account to where she will only be receiving the 40 mg esomeprazole instead of the 20 mg. Lmom for pt to return my call.

## 2021-08-07 NOTE — Telephone Encounter (Signed)
Pt was made aware and verbalized understanding.  

## 2021-11-12 ENCOUNTER — Other Ambulatory Visit (HOSPITAL_COMMUNITY): Payer: Self-pay | Admitting: Family Medicine

## 2021-11-12 DIAGNOSIS — Z1231 Encounter for screening mammogram for malignant neoplasm of breast: Secondary | ICD-10-CM

## 2021-11-12 DIAGNOSIS — Z13820 Encounter for screening for osteoporosis: Secondary | ICD-10-CM

## 2021-11-24 ENCOUNTER — Other Ambulatory Visit: Payer: Self-pay | Admitting: Internal Medicine

## 2021-11-24 DIAGNOSIS — K219 Gastro-esophageal reflux disease without esophagitis: Secondary | ICD-10-CM

## 2021-11-24 DIAGNOSIS — K5900 Constipation, unspecified: Secondary | ICD-10-CM

## 2021-11-24 DIAGNOSIS — R1010 Upper abdominal pain, unspecified: Secondary | ICD-10-CM

## 2021-11-28 ENCOUNTER — Ambulatory Visit (HOSPITAL_COMMUNITY): Payer: Medicare HMO

## 2021-11-28 ENCOUNTER — Other Ambulatory Visit (HOSPITAL_COMMUNITY): Payer: Medicare HMO

## 2021-12-15 ENCOUNTER — Ambulatory Visit (HOSPITAL_COMMUNITY)
Admission: RE | Admit: 2021-12-15 | Discharge: 2021-12-15 | Disposition: A | Discharge status: Home / Self Care | Payer: Medicare HMO | Source: Ambulatory Visit | Attending: Family Medicine | Admitting: Family Medicine

## 2021-12-15 DIAGNOSIS — M810 Age-related osteoporosis without current pathological fracture: Secondary | ICD-10-CM | POA: Insufficient documentation

## 2021-12-15 DIAGNOSIS — Z1231 Encounter for screening mammogram for malignant neoplasm of breast: Secondary | ICD-10-CM | POA: Diagnosis present

## 2021-12-15 DIAGNOSIS — Z13820 Encounter for screening for osteoporosis: Secondary | ICD-10-CM | POA: Insufficient documentation

## 2021-12-15 DIAGNOSIS — Z780 Asymptomatic menopausal state: Secondary | ICD-10-CM | POA: Insufficient documentation

## 2022-02-15 ENCOUNTER — Other Ambulatory Visit: Payer: Self-pay | Admitting: Gastroenterology

## 2022-02-15 DIAGNOSIS — K5900 Constipation, unspecified: Secondary | ICD-10-CM

## 2022-02-15 DIAGNOSIS — K219 Gastro-esophageal reflux disease without esophagitis: Secondary | ICD-10-CM

## 2022-02-15 DIAGNOSIS — R1010 Upper abdominal pain, unspecified: Secondary | ICD-10-CM

## 2022-02-17 NOTE — Telephone Encounter (Signed)
Pt was made aware and verbalized understanding.  

## 2022-06-14 ENCOUNTER — Emergency Department (HOSPITAL_COMMUNITY): Payer: Medicare HMO

## 2022-06-14 ENCOUNTER — Emergency Department (HOSPITAL_COMMUNITY)
Admission: EM | Admit: 2022-06-14 | Discharge: 2022-06-14 | Disposition: A | Discharge status: Home / Self Care | Payer: Medicare HMO | Attending: Emergency Medicine | Admitting: Emergency Medicine

## 2022-06-14 ENCOUNTER — Other Ambulatory Visit: Payer: Self-pay

## 2022-06-14 ENCOUNTER — Encounter (HOSPITAL_COMMUNITY): Payer: Self-pay | Admitting: Emergency Medicine

## 2022-06-14 DIAGNOSIS — R079 Chest pain, unspecified: Secondary | ICD-10-CM | POA: Insufficient documentation

## 2022-06-14 DIAGNOSIS — Z79899 Other long term (current) drug therapy: Secondary | ICD-10-CM | POA: Insufficient documentation

## 2022-06-14 DIAGNOSIS — I10 Essential (primary) hypertension: Secondary | ICD-10-CM | POA: Insufficient documentation

## 2022-06-14 LAB — CBC
HCT: 41.8 % (ref 36.0–46.0)
Hemoglobin: 13.8 g/dL (ref 12.0–15.0)
MCH: 29.4 pg (ref 26.0–34.0)
MCHC: 33 g/dL (ref 30.0–36.0)
MCV: 89.1 fL (ref 80.0–100.0)
Platelets: 206 10*3/uL (ref 150–400)
RBC: 4.69 MIL/uL (ref 3.87–5.11)
RDW: 12.8 % (ref 11.5–15.5)
WBC: 11 10*3/uL — ABNORMAL HIGH (ref 4.0–10.5)
nRBC: 0 % (ref 0.0–0.2)

## 2022-06-14 LAB — BASIC METABOLIC PANEL
Anion gap: 9 (ref 5–15)
BUN: 13 mg/dL (ref 8–23)
CO2: 26 mmol/L (ref 22–32)
Calcium: 9 mg/dL (ref 8.9–10.3)
Chloride: 100 mmol/L (ref 98–111)
Creatinine, Ser: 0.76 mg/dL (ref 0.44–1.00)
GFR, Estimated: >60 mL/min (ref >60)
Glucose, Bld: 77 mg/dL (ref 70–99)
Potassium: 4.3 mmol/L (ref 3.5–5.1)
Sodium: 135 mmol/L (ref 135–145)

## 2022-06-14 LAB — TROPONIN I (HIGH SENSITIVITY)
Troponin I (High Sensitivity): 9 ng/L (ref <18)
Troponin I (High Sensitivity): 11 ng/L (ref <18)

## 2022-06-14 MED ORDER — ALUM & MAG HYDROXIDE-SIMETH 200-200-20 MG/5ML PO SUSP
30.0000 mL | Freq: Once | ORAL | Status: AC
  Administered 2022-06-14: 30 mL via ORAL
  Filled 2022-06-14: qty 30

## 2022-06-14 NOTE — ED Triage Notes (Signed)
Mid sternal chest pain stared yesterday. Does not radiate. Denies n/v/d. Denies SOB.  Took OTC antacid no relief.

## 2022-06-14 NOTE — ED Provider Notes (Signed)
[place] EMERGENCY DEPARTMENT AT [facility] Provider Note   CSN: [id number] Arrival date & time: 06/14/22  1245     History  Chief Complaint  Patient presents with   Chest Pain    Catherine Nichols is a 67 y.o. female.   Chest Pain Associated symptoms: no abdominal pain, no back pain, no cough, no dizziness, no fever, no nausea, no numbness, no shortness of breath, no vomiting and no weakness        Catherine Nichols is a 67 y.o. female with past medical history of GERD, peripheral artery disease, ascending aortic aneurysm with thoracic aortic aneurysm repair in 2017, IBS, hypertension, and GERD who presents to the Emergency Department complaining of central chest pain that started yesterday.  She describes "feels like a knot" to the center of her chest that does not radiate.  Has been waxing and waning in severity.  Unrelieved with position change or belching.  She denies any nausea vomiting diarrhea neck or jaw pain.  No pain radiating into her extremities.  She took over-the-counter antacid with no relief.  Home Medications Prior to Admission medications   Medication Sig Start Date End Date Taking? Authorizing Provider  acetaminophen (TYLENOL) 500 MG tablet Take 500 mg by mouth every 6 (six) hours as needed for mild pain or moderate pain.     [provider]  ALPRAZolam Prudy Feeler) 0.5 MG tablet Take 0.5-1 mg by mouth daily as needed for anxiety.  04/20/19   [provider]  esomeprazole (NEXIUM) 40 MG capsule TAKE 1 CAPSULE (40 MG TOTAL) BY MOUTH DAILY FOR 90 DOSES. 11/25/21 02/23/22  Tiffany Kocher, PA-C  famotidine (PEPCID) 20 MG tablet Take 20 mg by mouth 2 (two) times daily. 06/18/21   [provider]  fexofenadine (ALLEGRA) 180 MG tablet Take 180 mg by mouth at bedtime as needed for allergies.  04/20/19   [provider]  fluticasone (FLONASE) 50 MCG/ACT nasal spray Place into the nose.    [provider]  Magnesium  Hydroxide (DULCOLAX SOFT CHEWS PO) Take by mouth as needed. Takes 1 chew as needed    [provider]  metoprolol tartrate (LOPRESSOR) 100 MG tablet Take 1 tablet (100 mg total) by mouth 2 (two) times daily. 06/10/20   Jacalyn Lefevre, MD  polyethylene glycol powder (MIRALAX) 17 GM/SCOOP powder 17 grams mixed in 8 ounces of water or lemonade at bedtime daily. 09/17/20   Tiffany Kocher, PA-C  Probiotic Product (PROBIOTIC DAILY PO) Take 1 capsule by mouth daily.    [provider]      Allergies    Aspirin    Review of Systems   Review of Systems  Constitutional:  Negative for chills and fever.  Respiratory:  Negative for cough and shortness of breath.   Cardiovascular:  Positive for chest pain.  Gastrointestinal:  Negative for abdominal pain, diarrhea, nausea and vomiting.  Genitourinary:  Negative for flank pain.  Musculoskeletal:  Negative for back pain.  Neurological:  Negative for dizziness, weakness and numbness.    Physical Exam Updated Vital Signs BP (!) 150/44   Pulse 68   Temp 98.8 F (37.1 C)   Resp 18   Ht 5\' 6"  (1.676 m)   Wt 58.5 kg   LMP 03/09/1998   SpO2 95%   BMI 20.82 kg/m  Physical Exam Vitals and nursing note reviewed.  Constitutional:      General: She is not in acute distress.    Appearance:  She is well-developed. She is not ill-appearing or toxic-appearing.  HENT:     Mouth/Throat:     Mouth: Mucous membranes are moist.  Cardiovascular:     Rate and Rhythm: Normal rate and regular rhythm.     Pulses: Normal pulses.     Heart sounds: Murmur heard.  Pulmonary:     Effort: Pulmonary effort is normal.  Chest:     Chest wall: No tenderness.  Abdominal:     General: There is no distension.     Palpations: Abdomen is soft.     Tenderness: There is no abdominal tenderness.  Musculoskeletal:        General: Normal range of motion.     Cervical back: Normal range of motion.     Right lower leg: No edema.     Left lower leg: No edema.   Skin:    General: Skin is warm.     Capillary Refill: Capillary refill takes less than 2 seconds.  Neurological:     General: No focal deficit present.     Mental Status: She is alert.     Sensory: No sensory deficit.     Motor: No weakness.     ED Results / Procedures / Treatments   Labs (all labs ordered are listed, but only abnormal results are displayed) Labs Reviewed  CBC - Abnormal; Notable for the following components:      Result Value   WBC 11.0 (*)    All other components within normal limits  BASIC METABOLIC PANEL  TROPONIN I (HIGH SENSITIVITY)  TROPONIN I (HIGH SENSITIVITY)    EKG EKG Interpretation  Date/Time:  Sunday June 14 2022 13:12:25 EDT Ventricular Rate:  70 PR Interval:  162 QRS Duration: 92 QT Interval:  392 QTC Calculation: 423 R Axis:   72 Text Interpretation: Normal sinus rhythm Septal infarct , age undetermined no acute ST/T changes similar to April 2022 Confirmed by Pricilla LovelessGoldston, Scott [phone]) on 06/14/2022 1:18:52 PM  Radiology DG Chest 2 View  Result Date: 06/14/2022 CLINICAL DATA:  Midsternal chest pain since yesterday. EXAM: CHEST - 2 VIEW COMPARISON:  06/10/2020. FINDINGS: The cardiac silhouette remains borderline enlarged. The aortic arch and descending aorta remain diffusely enlarged and tortuous. Stable median sternotomy wires and mediastinal surgical clips. Stable prosthetic heart valve. Mild linear scarring at the right lung base without significant change. Otherwise, clear lungs with normal vascularity. Diffuse osteopenia. IMPRESSION: 1. No acute abnormality. 2. Stable borderline cardiomegaly and diffuse enlargement of the aortic arch and descending aorta. Electronically Signed   By: Beckie SaltsSteven  Reid M.D.   On: 06/14/2022 13:34    Procedures Procedures    Medications Ordered in ED Medications - No data to display  ED Course/ Medical Decision Making/ A&P                             Medical Decision Making Patient here with history of  surgical repair in 2017 of thoracic aortic aneurysm.  Here with central chest discomfort since last night.  Pain has been intermittent and she believes is associated with recent constipation and her IBS.  She tried antacid without relief.  Took a laxative as well with minimal to no relief. Denies feeling short of breath, back pain, tearing sensations of her chest, or having excess belching.    Differential would include but not limited to PE, esophagitis, ACS, dissection although this is felt to be less likely given no description  of tearing pain, back pain or hypotension.  PE also felt less likely given lack of pleuritic pain and shortness of breath.  Amount and/or Complexity of Data Reviewed Labs: ordered.    Details: Labs interpreted by me, essentially unremarkable.  Troponin reassuring at 9, delta Trop 11 Radiology: ordered.    Details: Chest x-ray shows no acute abnormality stable borderline cardiomegaly and diffuse enlargement of the aortic arch and descending aorta ECG/medicine tests: ordered.    Details: EKG shows normal sinus rhythm, septal infarct, age-indeterminate.  No acute ST and T wave changes.  Similar to April 2022 Discussion of management or test interpretation with external provider(s): Patient given GI cocktail, on recheck she is resting comfortably.  Reports feeling better.  States she is ready for discharge home.  She has upcoming appointment with her GI provider and [facility].  Strict return precautions were given.  Risk OTC drugs.           Final Clinical Impression(s) / ED Diagnoses Final diagnoses:  Nonspecific chest pain    Rx / DC Orders ED Discharge Orders     None         Pauline Ausriplett, Uday Jantz, PA-C 06/16/22 1454    Pricilla LovelessGoldston, Scott, MD 06/22/22 1713

## 2022-06-14 NOTE — Discharge Instructions (Signed)
Call your cardiologist on Monday to arrange a follow-up appointment.  Also keep your upcoming appointment with your GI provider.  Return to the emergency department for any new or worsening symptoms.

## 2022-08-14 IMAGING — CT NM PET TUM IMG INITIAL (PI) SKULL BASE T - THIGH
4 series · 25 of 25 positions shown · non-contrast
Comparison: CT chest 07/10/2020.

CLINICAL DATA: Initial treatment strategy for lung nodule.

EXAM:
NUCLEAR MEDICINE PET SKULL BASE TO THIGH
TECHNIQUE: 8.3 mCi F-18 FDG was injected intravenously. Full-ring PET imaging
was performed from the skull base to thigh after the radiotracer. CT
data was obtained and used for attenuation correction and anatomic
localization.
Fasting blood glucose: 72 mg/dl

[Series 3: ct wb fusion · axial · 5.0mm · 0.98mm/px · z∈[-1492,-616]mm · 8 of 351 slices shown]
[im 1/351]
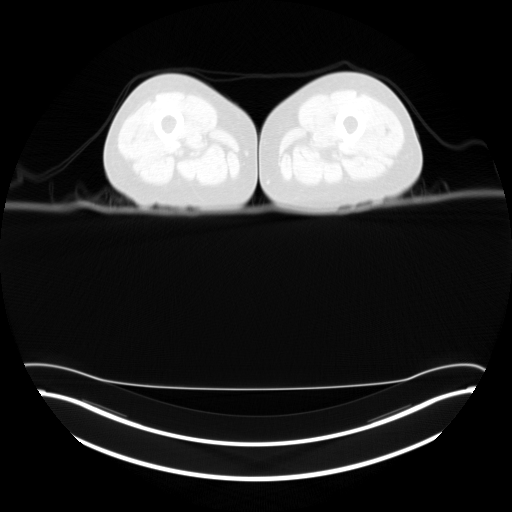
[im 51/351]
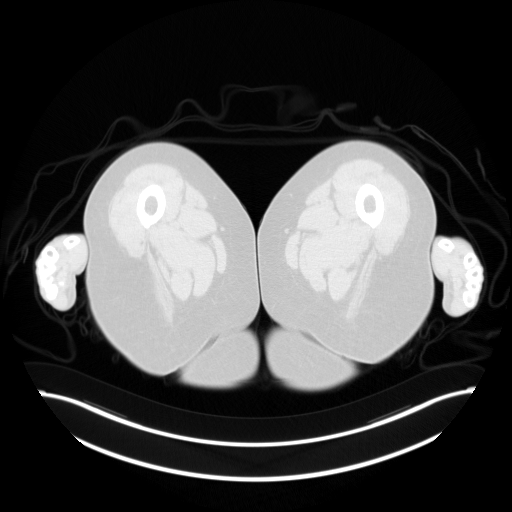
[im 101/351]
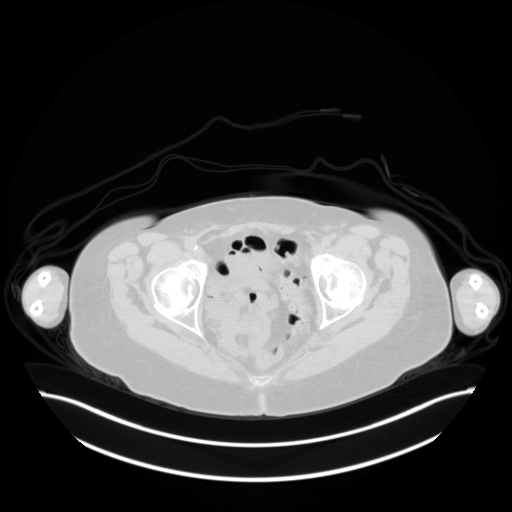
[im 151/351]
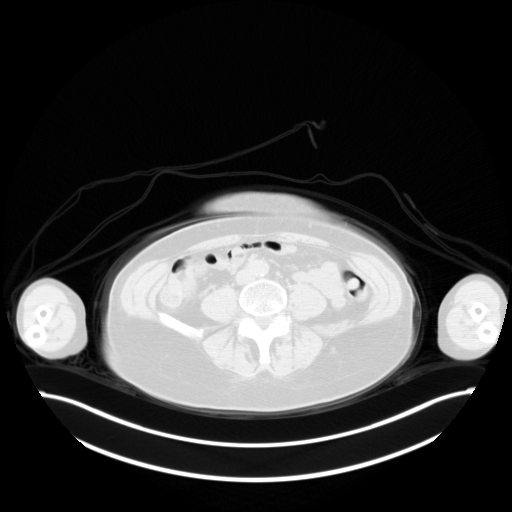
[im 201/351]
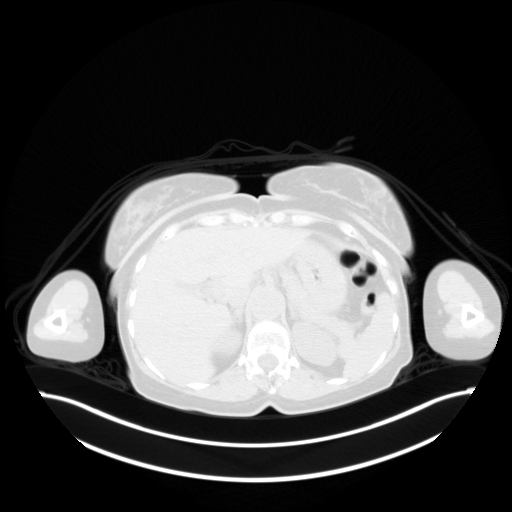
[im 251/351]
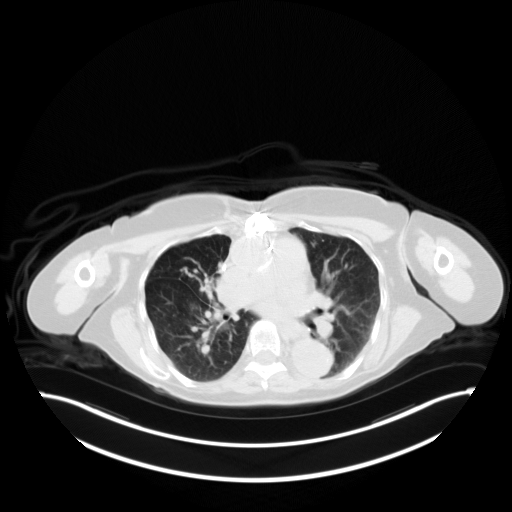
[im 301/351]
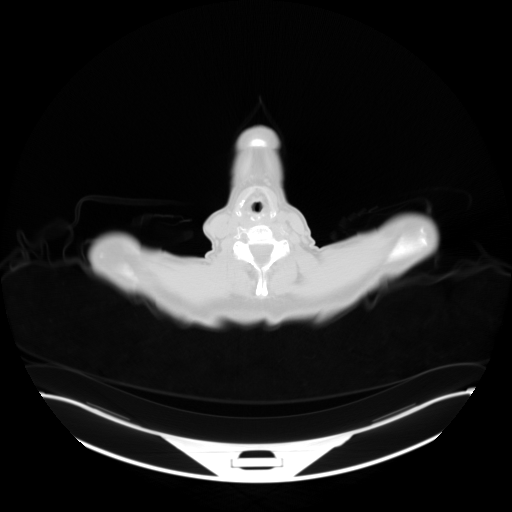
[im 351/351  brain]
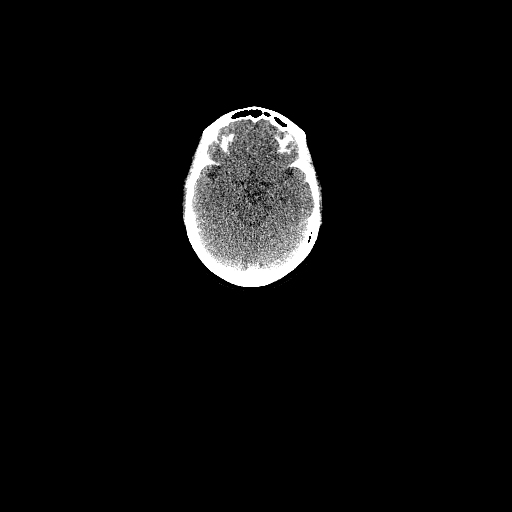

[Series 4: pet wb · axial · 5.0mm · 4.11mm/px · z∈[-1492,-616]mm · 8 of 351 slices shown]
[im 1/351]
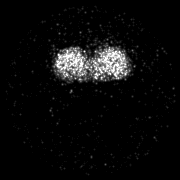
[im 51/351]
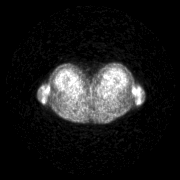
[im 101/351]
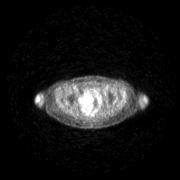
[im 151/351]
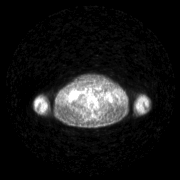
[im 201/351]
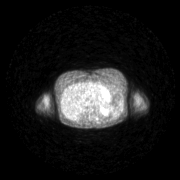
[im 251/351]
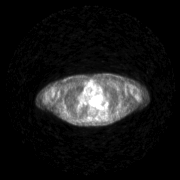
[im 301/351]
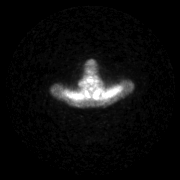
[im 351/351]
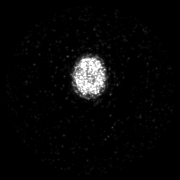

[Series 5: pet wb uncorrected · axial · 5.0mm · 4.11mm/px · z∈[-1492,-616]mm · 8 of 351 slices shown]
[im 1/351]
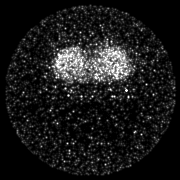
[im 51/351]
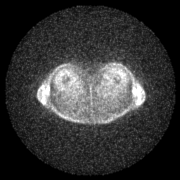
[im 101/351]
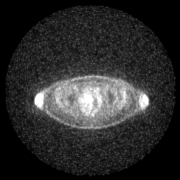
[im 151/351]
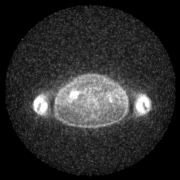
[im 201/351]
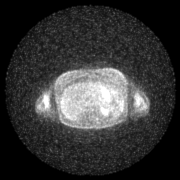
[im 251/351]
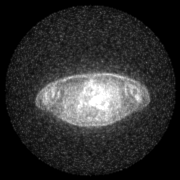
[im 301/351]
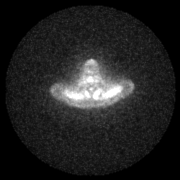
[im 351/351]
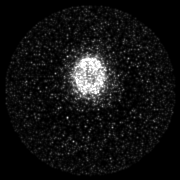

[results mm oncology reading · 1.0mm · 1.32mm/px · 1 of 7 slices shown]
[im 1/7]
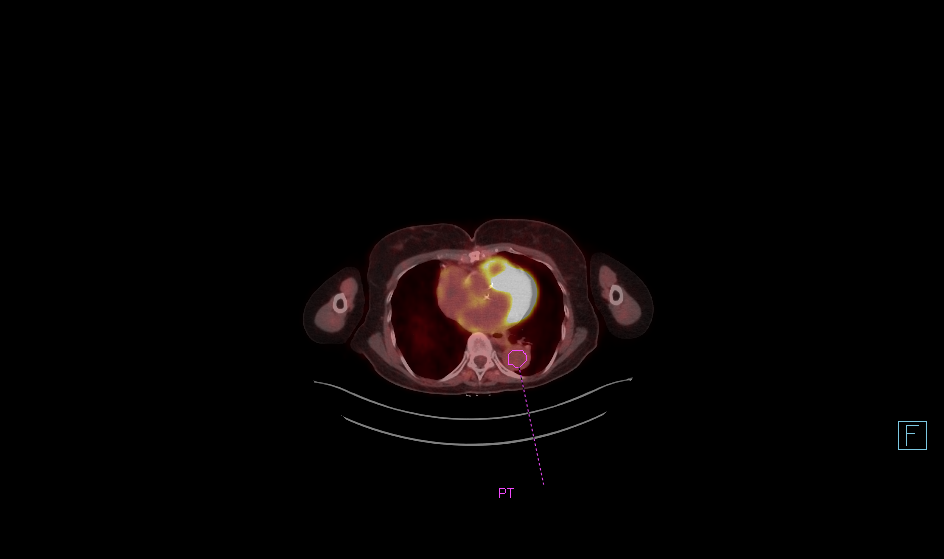

[25 of 25 positions shown; findings below may reference images not displayed]

FINDINGS: Mediastinal blood pool activity: SUV max

Liver activity: SUV max NA

NECK:

No abnormal hypermetabolism.

Incidental CT findings:

None.

CHEST:

Bilateral low paratracheal lymph nodes are mildly hypermetabolic.
Index low right paratracheal lymph node measures 8 mm with an SUV
max of 5.8. 6 mm prevascular lymph node has an SUV max 3.7. Focal
hypermetabolism in the left hilum has an SUV max of 3.4. No definite
CT correlate on 07/10/2020. 6 mm subcarinal lymph node, SUV max 4.5.
Nodular volume loss in the medial left lower lobe measures
approximately 9 mm with an SUV max of 2.5. No hypermetabolic right
hilar or axillary lymph nodes.

Incidental CT findings:

Atherosclerotic calcification of the aorta. Heart is enlarged. No
pericardial or effusion. Right upper lobectomy.

ABDOMEN/PELVIS:

Mild focal hypermetabolism associated with the inferior body of the
left adrenal gland, without a definite CT correlate, SUV max 3.6. No
abnormal hypermetabolism in the liver, right adrenal gland, spleen
or pancreas. No hypermetabolic lymph nodes.

Incidental CT findings:

Liver and gallbladder are unremarkable. Slight nodular thickening of
the body of the right adrenal gland. Left adrenal gland and right
kidney are otherwise unremarkable. There may be a subcentimeter
low-attenuation lesion in the lower pole left kidney, too small to
characterize. Spleen, pancreas, stomach and bowel are unremarkable.
Retroperitoneal lymph nodes are not enlarged by CT size criteria.
Atherosclerotic calcification of the aorta.

SKELETON:

No abnormal hypermetabolism.

Incidental CT findings:

None.
IMPRESSION: 1. Hypermetabolic mediastinal lymph nodes, indicative of disease
recurrence.
2. Hypermetabolic left lower lobe nodule, also worrisome for disease
recurrence.
3. Mild hypermetabolism in the inferior body of the left adrenal
gland without a definite CT correlate. Recommend attention on
follow-up.
4.  Aortic atherosclerosis ([id number]).

## 2022-09-21 ENCOUNTER — Encounter: Payer: Self-pay | Admitting: *Deleted

## 2022-11-20 ENCOUNTER — Other Ambulatory Visit (HOSPITAL_COMMUNITY): Payer: Self-pay | Admitting: Family Medicine

## 2022-11-20 DIAGNOSIS — Z1231 Encounter for screening mammogram for malignant neoplasm of breast: Secondary | ICD-10-CM

## 2022-12-24 ENCOUNTER — Ambulatory Visit (HOSPITAL_COMMUNITY): Payer: Medicare HMO

## 2022-12-24 ENCOUNTER — Encounter (HOSPITAL_COMMUNITY): Payer: Self-pay

## 2022-12-24 ENCOUNTER — Ambulatory Visit (HOSPITAL_COMMUNITY)
Admission: RE | Admit: 2022-12-24 | Discharge: 2022-12-24 | Disposition: A | Discharge status: Home / Self Care | Payer: Medicare HMO | Source: Ambulatory Visit | Attending: Family Medicine | Admitting: Family Medicine

## 2022-12-24 DIAGNOSIS — Z1231 Encounter for screening mammogram for malignant neoplasm of breast: Secondary | ICD-10-CM | POA: Diagnosis present

## 2023-05-26 ENCOUNTER — Telehealth: Payer: Self-pay | Admitting: Cardiology

## 2023-05-26 ENCOUNTER — Encounter: Payer: Self-pay | Admitting: Cardiology

## 2023-05-26 NOTE — Telephone Encounter (Signed)
 Patient stated she now sees a cardiologist at [facility].

## 2023-07-03 IMAGING — CT CT ABD-PELV W/ CM
2 of 5 series · 15 of 46 positions shown, 17 images · IV contrast (APPLIED)
Comparison: 04/10/2020

CLINICAL DATA: Abdominal pain and constipation.

EXAM:
CT ABDOMEN AND PELVIS WITH CONTRAST
TECHNIQUE: Multidetector CT imaging of the abdomen and pelvis was performed
using the standard protocol following bolus administration of
intravenous contrast.

[Series 2: axial st · axial · 0.69mm/px · z∈[-786,-406]mm · 12 of 88 slices shown, 14 images]
[im 6/88  soft-tissue]
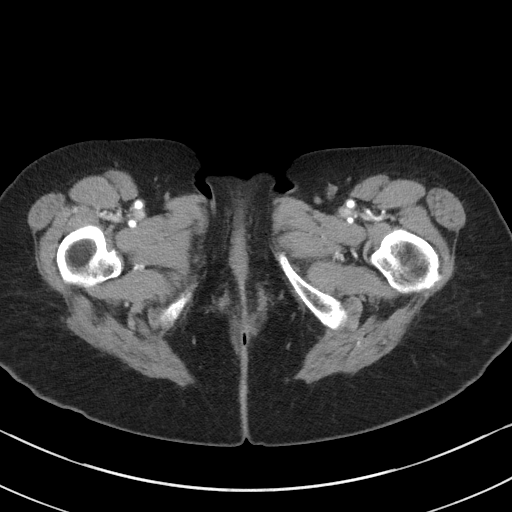
[im 6/88  bone]
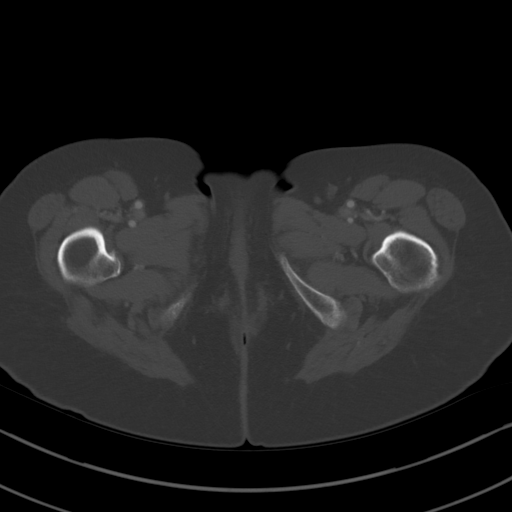
[im 12/88  soft-tissue]
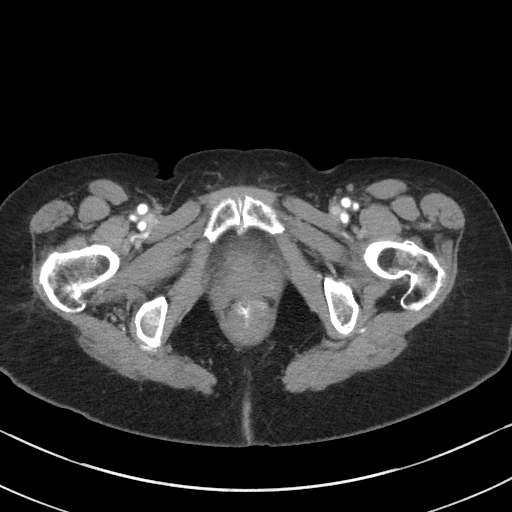
[im 18/88  soft-tissue]
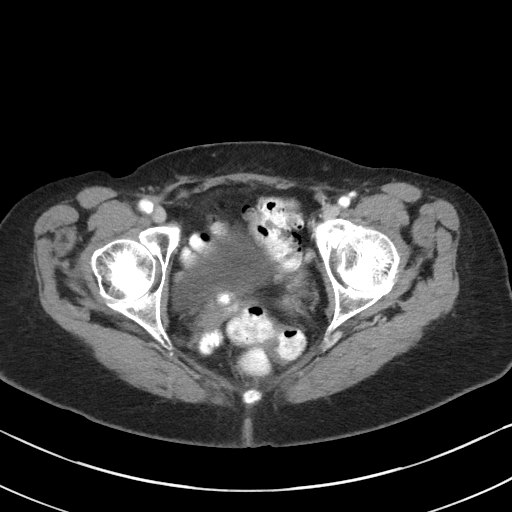
[im 30/88  soft-tissue]
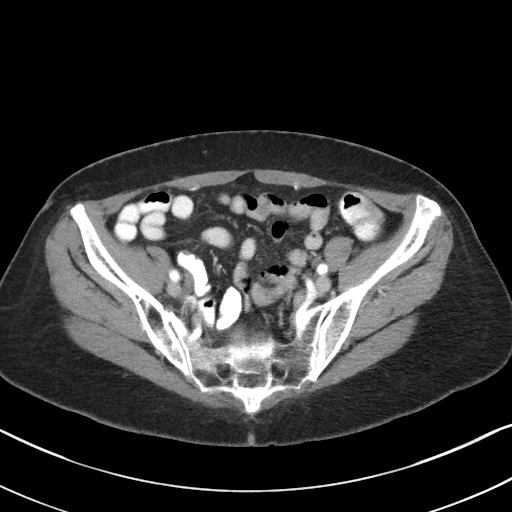
[im 35/88  soft-tissue]
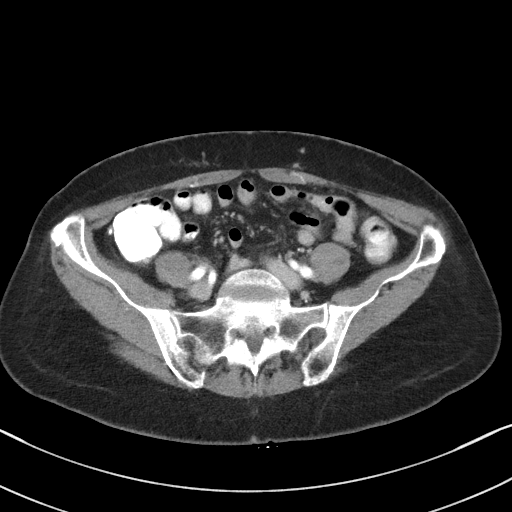
[im 41/88  soft-tissue]
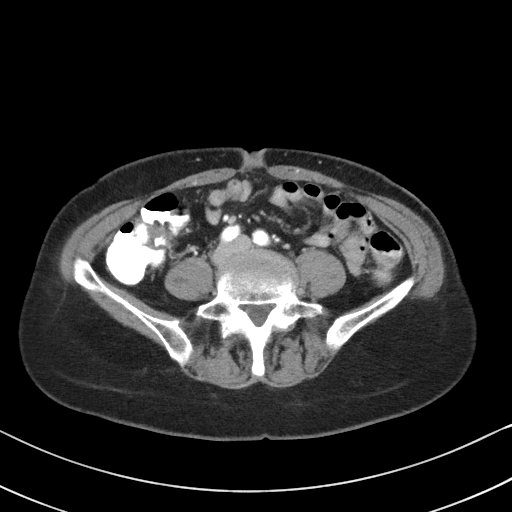
[im 47/88  soft-tissue]
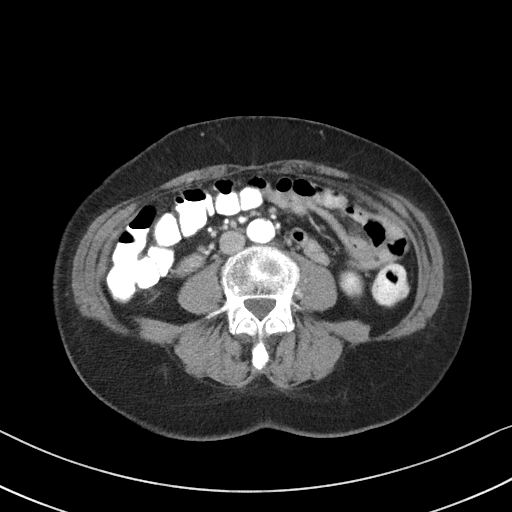
[im 53/88  soft-tissue]
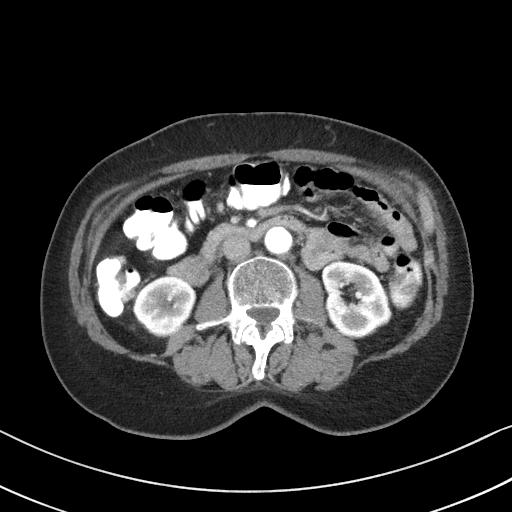
[im 59/88  soft-tissue]
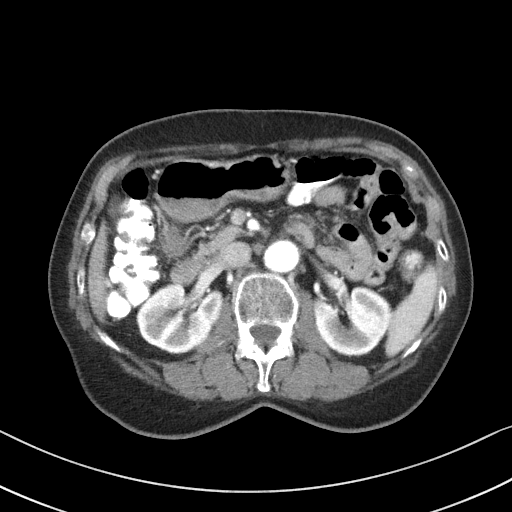
[im 59/88  bone]
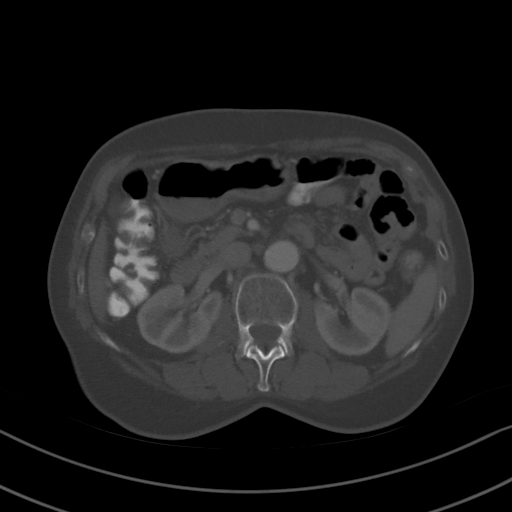
[im 70/88  soft-tissue]
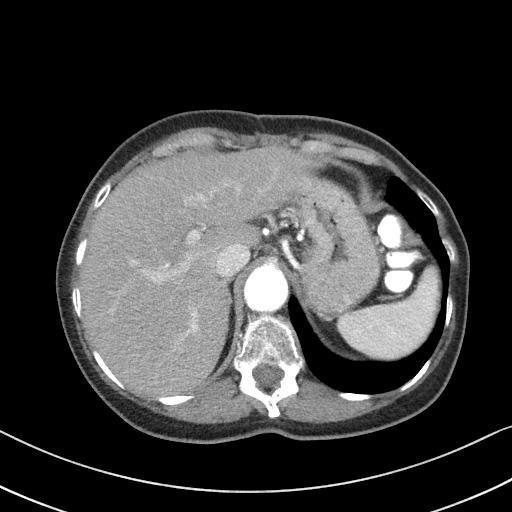
[im 76/88  soft-tissue]
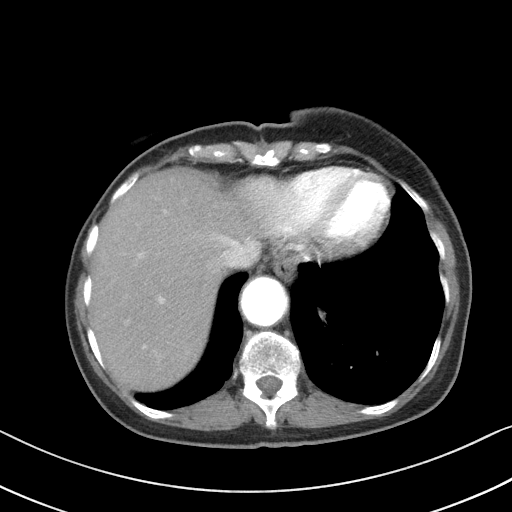
[im 82/88  soft-tissue]
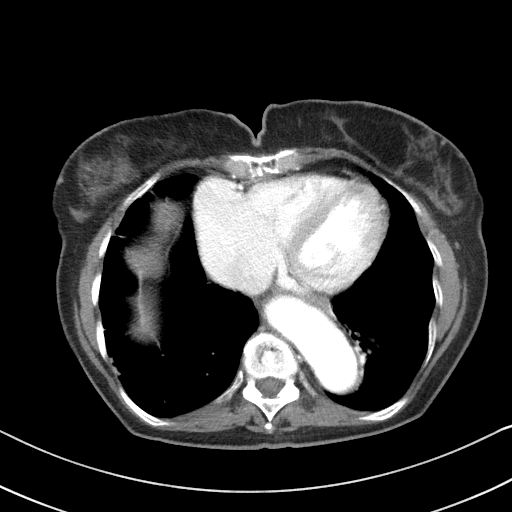

[Series 5: coronal st · coronal · 0.60mm/px · 3 of 81 slices shown]
[im 27/81  soft-tissue]
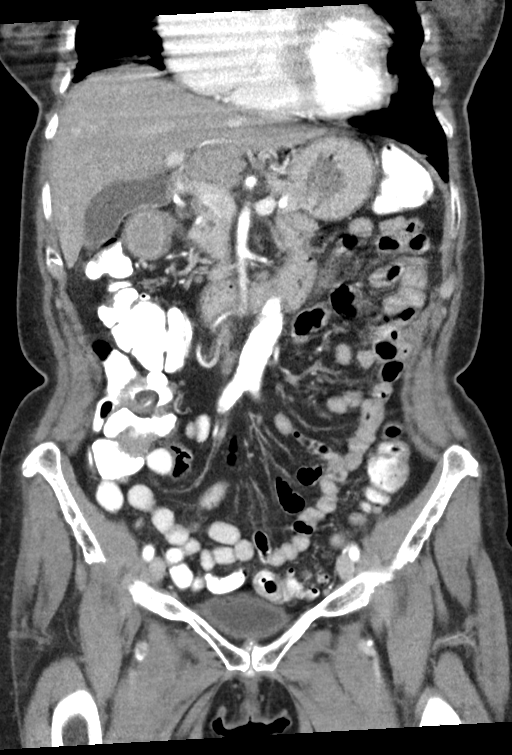
[im 36/81  soft-tissue]
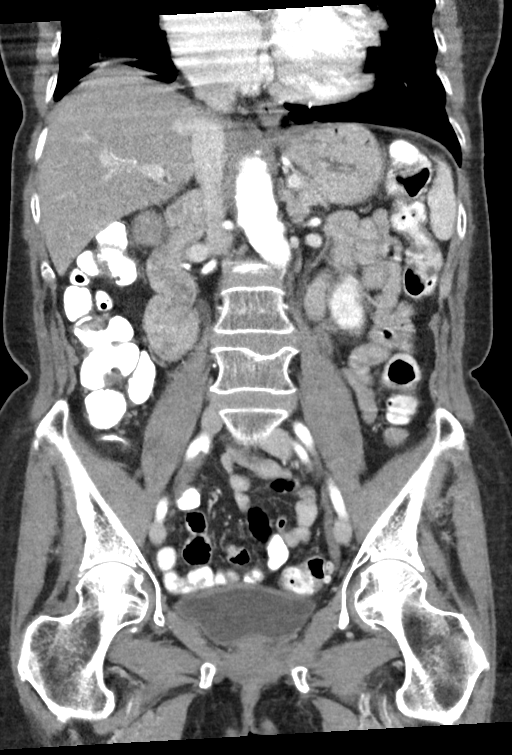
[im 45/81  soft-tissue]
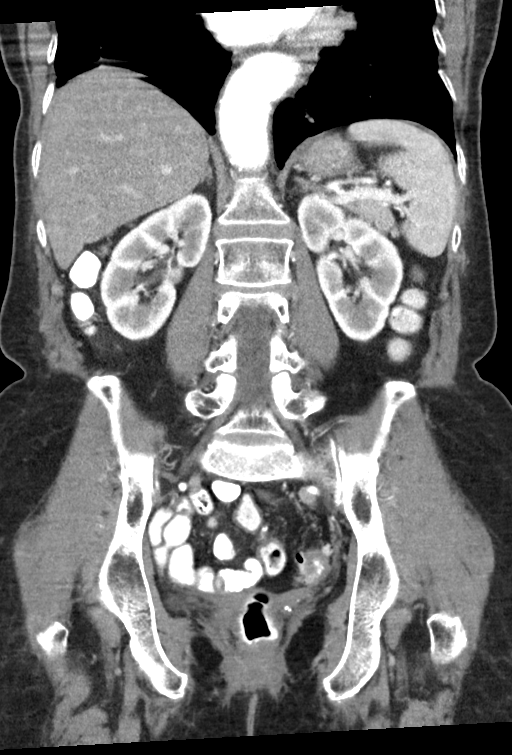

[15 of 46 positions shown; findings below may reference images not displayed]

RADIATION DOSE REDUCTION: This exam was performed according to the
departmental dose-optimization program which includes automated
exposure control, adjustment of the mA and/or kV according to
patient size and/or use of iterative reconstruction technique.

CONTRAST:  100mL OMNIPAQUE IOHEXOL 300 MG/ML  SOLN
FINDINGS: Lower chest: Scattered areas of atelectasis or scarring are noted in
the lung bases.

Hepatobiliary: No suspicious focal abnormality within the liver
parenchyma. There is no evidence for gallstones, gallbladder wall
thickening, or pericholecystic fluid. No intrahepatic or
extrahepatic biliary dilation.

Pancreas: No focal mass lesion. No dilatation of the main duct. No
intraparenchymal cyst. No peripancreatic edema.

Spleen: No splenomegaly. No focal mass lesion.

Adrenals/Urinary Tract: No adrenal nodule or mass. Kidneys
unremarkable. No evidence for hydroureter. The urinary bladder
appears normal for the degree of distention.

Stomach/Bowel: Stomach is unremarkable. No gastric wall thickening.
No evidence of outlet obstruction. Duodenum is normally positioned
as is the ligament of Treitz. No small bowel wall thickening. No
small bowel dilatation. The terminal ileum is normal. The appendix
is normal. No gross colonic mass. No colonic wall thickening.
Diverticular changes are noted in the left colon without evidence of
diverticulitis.

Vascular/Lymphatic: There is mild atherosclerotic calcification of
the abdominal aorta without aneurysm. There is no gastrohepatic or
hepatoduodenal ligament lymphadenopathy. No retroperitoneal or
mesenteric lymphadenopathy. No pelvic sidewall lymphadenopathy.

Reproductive: Uterus surgically absent.  There is no adnexal mass.

Other: No intraperitoneal free fluid.

Musculoskeletal: No worrisome lytic or sclerotic osseous
abnormality.
IMPRESSION: 1. No acute findings in the abdomen or pelvis. Specifically, no
findings to explain the patient's history of abdominal pain and
constipation.
2. Left colonic diverticulosis without diverticulitis.
3. Aortic Atherosclerosis ([id number]).

## 2024-02-09 ENCOUNTER — Other Ambulatory Visit (HOSPITAL_COMMUNITY): Payer: Self-pay | Admitting: Nurse Practitioner

## 2024-02-09 DIAGNOSIS — Z1231 Encounter for screening mammogram for malignant neoplasm of breast: Secondary | ICD-10-CM

## 2024-02-17 ENCOUNTER — Inpatient Hospital Stay (HOSPITAL_COMMUNITY)
Admission: RE | Admit: 2024-02-17 | Discharge: 2024-02-17 | Attending: Nurse Practitioner | Admitting: Nurse Practitioner

## 2024-02-17 DIAGNOSIS — Z1231 Encounter for screening mammogram for malignant neoplasm of breast: Secondary | ICD-10-CM | POA: Insufficient documentation

## 2024-02-24 ENCOUNTER — Other Ambulatory Visit (HOSPITAL_COMMUNITY): Payer: Self-pay | Admitting: Nurse Practitioner

## 2024-02-24 DIAGNOSIS — R928 Other abnormal and inconclusive findings on diagnostic imaging of breast: Secondary | ICD-10-CM

## 2024-03-28 ENCOUNTER — Ambulatory Visit (HOSPITAL_COMMUNITY)
Admission: RE | Admit: 2024-03-28 | Discharge: 2024-03-28 | Disposition: A | Source: Ambulatory Visit | Attending: Nurse Practitioner | Admitting: Nurse Practitioner

## 2024-03-28 ENCOUNTER — Ambulatory Visit (HOSPITAL_COMMUNITY)
Admission: RE | Admit: 2024-03-28 | Discharge: 2024-03-28 | Disposition: A | Discharge status: Home / Self Care | Source: Ambulatory Visit | Attending: Nurse Practitioner | Admitting: Nurse Practitioner

## 2024-03-28 ENCOUNTER — Encounter (HOSPITAL_COMMUNITY): Payer: Self-pay

## 2024-03-28 DIAGNOSIS — R928 Other abnormal and inconclusive findings on diagnostic imaging of breast: Secondary | ICD-10-CM

## 2024-04-13 ENCOUNTER — Other Ambulatory Visit (HOSPITAL_COMMUNITY): Payer: Self-pay | Admitting: Family Medicine

## 2024-04-13 DIAGNOSIS — R109 Unspecified abdominal pain: Secondary | ICD-10-CM

## 2024-04-18 ENCOUNTER — Ambulatory Visit (HOSPITAL_COMMUNITY)
